# Patient Record
Sex: Male | Born: 1985 | Race: White | Hispanic: No | Marital: Single | Smoking: Never smoker
Health system: Southern US, Community
[De-identification: ages and names within clinical notes are randomized; demographics above are authoritative.]

## PROBLEM LIST (undated history)

## (undated) DIAGNOSIS — E785 Hyperlipidemia, unspecified: Secondary | ICD-10-CM

## (undated) DIAGNOSIS — I10 Essential (primary) hypertension: Secondary | ICD-10-CM

## (undated) DIAGNOSIS — F419 Anxiety disorder, unspecified: Secondary | ICD-10-CM

## (undated) HISTORY — DX: Hyperlipidemia, unspecified: E78.5

## (undated) HISTORY — DX: Essential (primary) hypertension: I10

## (undated) HISTORY — DX: Anxiety disorder, unspecified: F41.9

## (undated) HISTORY — PX: MOLE REMOVAL: SHX2046

---

## 2021-08-18 ENCOUNTER — Telehealth: Payer: Self-pay

## 2021-08-18 NOTE — Telephone Encounter (Signed)
Left vm to confirm 08/22/21 appointment-[name] °

## 2021-08-22 ENCOUNTER — Encounter: Payer: Self-pay | Admitting: Nurse Practitioner

## 2021-08-22 ENCOUNTER — Other Ambulatory Visit: Payer: Self-pay

## 2021-08-22 ENCOUNTER — Ambulatory Visit: Payer: BC Managed Care – PPO | Admitting: Nurse Practitioner

## 2021-08-22 VITALS — BP 130/80 | HR 60 | Temp 98.7°F | Resp 16 | Ht 67.5 in | Wt 194.6 lb

## 2021-08-22 DIAGNOSIS — E559 Vitamin D deficiency, unspecified: Secondary | ICD-10-CM | POA: Diagnosis not present

## 2021-08-22 DIAGNOSIS — R221 Localized swelling, mass and lump, neck: Secondary | ICD-10-CM | POA: Diagnosis not present

## 2021-08-22 DIAGNOSIS — E782 Mixed hyperlipidemia: Secondary | ICD-10-CM | POA: Diagnosis not present

## 2021-08-22 DIAGNOSIS — K219 Gastro-esophageal reflux disease without esophagitis: Secondary | ICD-10-CM | POA: Diagnosis not present

## 2021-08-22 DIAGNOSIS — Z7689 Persons encountering health services in other specified circumstances: Secondary | ICD-10-CM

## 2021-08-22 NOTE — Progress Notes (Signed)
[facility] [place], [place] [address]  Internal MEDICINE  Office Visit Note  Patient Name: Daniel Lowery.  [id number]  [id number]  Date of Service: 09/17/2021   Complaints/HPI Pt is here for establishment of PCP. Chief Complaint  Patient presents with   New Patient (Initial Visit)    Knot in throat, has been there for years, not painful but can tell its there, causes some pressure    HPI Daniel Lowery presents for a new patient visit to establish care.  He is a well-appearing 36 year old male with gastroesophageal reflux and no other significant medical conditions.  He lives at home with family and works full-time driving a delivery truck.  He is a former smoker and quit approximately 1 year ago and he vaped for a few months after quitting smoking.  He drinks approximately 6-12 beers per week and denies any use of recreational drugs.  His main concern today is that he does have a knot or lump on his neck that has been there for a few years.  He reports that the lump is not painful but he can tell that it is there and sometimes it causes some pressure on his throat.  He denies having any difficulty breathing due to it or having it swell or become red or tender like it is infected. He is due for an annual physical exam and routine labs.  He is not due for any other preventive screenings.  His vital signs are within normal limits and his BMI is elevated.     Current Medication: Outpatient Encounter Medications as of 08/22/2021  Medication Sig   famotidine (PEPCID AC) 10 MG tablet Take 10 mg by mouth 2 (two) times daily.   No facility-administered encounter medications on file as of 08/22/2021.    Surgical History: Past Surgical History:  Procedure Laterality Date   MOLE REMOVAL     2 moles on back removed    Medical History: History reviewed. No pertinent past medical history.  Family History: Family History  Problem Relation Age of Onset   Cancer  Mother    Hypertension Father    Heart disease Father    Heart disease Paternal Grandfather     Social History   Socioeconomic History   Marital status: Significant Other    Spouse name: Not on file   Number of children: Not on file   Years of education: Not on file   Highest education level: Not on file  Occupational History   Not on file  Tobacco Use   Smoking status: Never   Smokeless tobacco: Current  Substance and Sexual Activity   Alcohol use: Yes   Drug use: Not Currently   Sexual activity: Not on file  Other Topics Concern   Not on file  Social History Narrative   Not on file   Social Determinants of Health   Financial Resource Strain: Not on file  Food Insecurity: Not on file  Transportation Needs: Not on file  Physical Activity: Not on file  Stress: Not on file  Social Connections: Not on file  Intimate Partner Violence: Not on file     Review of Systems  Constitutional:  Negative for chills, fatigue and unexpected weight change.  HENT:  Negative for congestion, rhinorrhea, sneezing and sore throat.   Eyes:  Negative for redness.  Respiratory:  Negative for cough, chest tightness and shortness of breath.   Cardiovascular:  Negative for chest pain and palpitations.  Gastrointestinal:  Negative  for abdominal pain, constipation, diarrhea, nausea and vomiting.  Genitourinary:  Negative for dysuria and frequency.  Musculoskeletal:  Negative for arthralgias, back pain, joint swelling and neck pain.  Skin:  Negative for rash.  Neurological: Negative.  Negative for tremors and numbness.  Hematological:  Negative for adenopathy. Does not bruise/bleed easily.  Psychiatric/Behavioral:  Negative for behavioral problems (Depression), sleep disturbance and suicidal ideas. The patient is not nervous/anxious.    Vital Signs: BP 130/80 Comment: 154/100   Pulse 60    Temp 98.7 F (37.1 C)    Resp 16    Ht 5' 7.5" (1.715 m)    Wt 194 lb 9.6 oz (88.3 kg)    SpO2 99%     BMI 30.03 kg/m    Physical Exam Vitals reviewed.  Constitutional:      General: He is not in acute distress.    Appearance: Normal appearance. He is obese. He is not ill-appearing.  HENT:     Head: Normocephalic and atraumatic.  Eyes:     Pupils: Pupils are equal, round, and reactive to light.  Cardiovascular:     Rate and Rhythm: Normal rate and regular rhythm.  Pulmonary:     Effort: Pulmonary effort is normal. No respiratory distress.  Skin:    Comments: Lump/mass on right side of neck. Nontender, not painful, soft and compressible, moveable.   Neurological:     Mental Status: He is alert and oriented to person, place, and time.  Psychiatric:        Mood and Affect: Mood normal.        Behavior: Behavior normal.      Assessment/Plan: 1. Localized swelling, mass or lump of neck Patient has a lump/mass on the right side of his neck, ultrasound ordered for further evaluation. Additional labs ordered. Thyroid lab ordered. Lump/mass is not in the area of the thyroid.  - CBC with Differential/Platelet - CMP14+EGFR - US SOFT TISSUE HEAD & NECK (NON-THYROID); Future - TSH + free T4  2. Gastroesophageal reflux disease without esophagitis Labs ordered. Currently patient avoids triggers for acid reflux. Takes famotidine 10 mg twice daily.  - CBC with Differential/Platelet - CMP14+EGFR  3. Vitamin D deficiency Labs ordered.  - CBC with Differential/Platelet - CMP14+EGFR - Vitamin D (25 hydroxy)  4. Mixed hyperlipidemia Routine labs ordered.  - CBC with Differential/Platelet - CMP14+EGFR - Lipid Profile  5. Encounter to establish care with new doctor Routine labs ordered. Patient has never had a PCP, feels like he needs one now and wants to keep track of his health and preventive screenings as he gets older.  - CBC with Differential/Platelet - CMP14+EGFR    General Counseling: diago haik understanding of the findings of todays visit and agrees with plan of  treatment. I have discussed any further diagnostic evaluation that may be needed or ordered today. We also reviewed his medications today. he has been encouraged to call the office with any questions or concerns that should arise related to todays visit.    Counseling:  [place] Controlled Substance Database was reviewed by me.  Orders Placed This Encounter  Procedures   US SOFT TISSUE HEAD & NECK (NON-THYROID)   CBC with Differential/Platelet   CMP14+EGFR   Vitamin D (25 hydroxy)   Lipid Profile   TSH + free T4    No orders of the defined types were placed in this encounter.   Return for CPE at earliest available opening. also need F/U for neck ultrasound.  Time spent:30  Minutes Time spent with patient included reviewing progress notes, labs, imaging studies, and discussing plan for follow up.    This patient was seen by Jonetta Osgood, FNP-C in collaboration with Dr. Clayborn Bigness as a part of collaborative care agreement.    Kyro Joswick R. Valetta Fuller, MSN, FNP-C Internal Medicine

## 2021-08-29 DIAGNOSIS — Z7689 Persons encountering health services in other specified circumstances: Secondary | ICD-10-CM | POA: Diagnosis not present

## 2021-08-29 DIAGNOSIS — K219 Gastro-esophageal reflux disease without esophagitis: Secondary | ICD-10-CM | POA: Diagnosis not present

## 2021-08-29 DIAGNOSIS — E559 Vitamin D deficiency, unspecified: Secondary | ICD-10-CM | POA: Diagnosis not present

## 2021-08-29 DIAGNOSIS — E782 Mixed hyperlipidemia: Secondary | ICD-10-CM | POA: Diagnosis not present

## 2021-08-29 DIAGNOSIS — R221 Localized swelling, mass and lump, neck: Secondary | ICD-10-CM | POA: Diagnosis not present

## 2021-08-29 LAB — LIPID PANEL

## 2021-08-30 LAB — CBC WITH DIFFERENTIAL/PLATELET
Basophils Absolute: 0.1 10*3/uL (ref 0.0–0.2)
Basos: 1 %
EOS (ABSOLUTE): 0.1 10*3/uL (ref 0.0–0.4)
Eos: 1 %
Hematocrit: 49.6 % (ref 37.5–51.0)
Hemoglobin: 16.8 g/dL (ref 13.0–17.7)
Immature Grans (Abs): 0 10*3/uL (ref 0.0–0.1)
Immature Granulocytes: 0 %
Lymphocytes Absolute: 2.1 10*3/uL (ref 0.7–3.1)
Lymphs: 30 %
MCH: 30.4 pg (ref 26.6–33.0)
MCHC: 33.9 g/dL (ref 31.5–35.7)
MCV: 90 fL (ref 79–97)
Monocytes Absolute: 0.6 10*3/uL (ref 0.1–0.9)
Monocytes: 9 %
Neutrophils Absolute: 4.2 10*3/uL (ref 1.4–7.0)
Neutrophils: 59 %
Platelets: 291 10*3/uL (ref 150–450)
RBC: 5.53 x10E6/uL (ref 4.14–5.80)
RDW: 12.6 % (ref 11.6–15.4)
WBC: 7.1 10*3/uL (ref 3.4–10.8)

## 2021-08-30 LAB — CMP14+EGFR
ALT: 33 IU/L (ref 0–44)
AST: 50 IU/L — ABNORMAL HIGH (ref 0–40)
Albumin/Globulin Ratio: 1.9 (ref 1.2–2.2)
Albumin: 5.1 g/dL — ABNORMAL HIGH (ref 4.0–5.0)
Alkaline Phosphatase: 78 IU/L (ref 44–121)
BUN/Creatinine Ratio: 10 (ref 9–20)
BUN: 10 mg/dL (ref 6–20)
Bilirubin Total: 0.7 mg/dL (ref 0.0–1.2)
CO2: 24 mmol/L (ref 20–29)
Calcium: 9.8 mg/dL (ref 8.7–10.2)
Chloride: 99 mmol/L (ref 96–106)
Creatinine, Ser: 0.99 mg/dL (ref 0.76–1.27)
Globulin, Total: 2.7 g/dL (ref 1.5–4.5)
Glucose: 101 mg/dL — ABNORMAL HIGH (ref 70–99)
Potassium: 4.3 mmol/L (ref 3.5–5.2)
Sodium: 141 mmol/L (ref 134–144)
Total Protein: 7.8 g/dL (ref 6.0–8.5)
eGFR: 103 mL/min/{1.73_m2} (ref >59)

## 2021-08-30 LAB — LIPID PANEL
Chol/HDL Ratio: 6 ratio — ABNORMAL HIGH (ref 0.0–5.0)
Cholesterol, Total: 234 mg/dL — ABNORMAL HIGH (ref 100–199)
HDL: 39 mg/dL — ABNORMAL LOW (ref >39)
LDL Chol Calc (NIH): 115 mg/dL — ABNORMAL HIGH (ref 0–99)
Triglycerides: 456 mg/dL — ABNORMAL HIGH (ref 0–149)
VLDL Cholesterol Cal: 80 mg/dL — ABNORMAL HIGH (ref 5–40)

## 2021-08-30 LAB — TSH+FREE T4
Free T4: 1.33 ng/dL (ref 0.82–1.77)
TSH: 1.76 u[IU]/mL (ref 0.450–4.500)

## 2021-08-30 LAB — VITAMIN D 25 HYDROXY (VIT D DEFICIENCY, FRACTURES): Vit D, 25-Hydroxy: 17.6 ng/mL — ABNORMAL LOW (ref 30.0–100.0)

## 2021-09-06 ENCOUNTER — Other Ambulatory Visit: Payer: Self-pay

## 2021-09-06 ENCOUNTER — Ambulatory Visit (INDEPENDENT_AMBULATORY_CARE_PROVIDER_SITE_OTHER): Payer: BC Managed Care – PPO

## 2021-09-06 DIAGNOSIS — R221 Localized swelling, mass and lump, neck: Secondary | ICD-10-CM | POA: Diagnosis not present

## 2021-09-08 NOTE — Progress Notes (Signed)
I have reviewed the lab results. There are no critically abnormal values requiring immediate intervention but there are some abnormals that will be discussed at the next office visit.  

## 2021-09-17 ENCOUNTER — Encounter: Payer: Self-pay | Admitting: Nurse Practitioner

## 2021-09-18 ENCOUNTER — Telehealth: Payer: Self-pay

## 2021-09-18 NOTE — Telephone Encounter (Signed)
Left vm and sent mychart message to confirm 09/20/21 appointment-[name] ?

## 2021-09-20 ENCOUNTER — Other Ambulatory Visit: Payer: Self-pay

## 2021-09-20 ENCOUNTER — Encounter: Payer: Self-pay | Admitting: Nurse Practitioner

## 2021-09-20 ENCOUNTER — Ambulatory Visit (INDEPENDENT_AMBULATORY_CARE_PROVIDER_SITE_OTHER): Payer: BC Managed Care – PPO | Admitting: Nurse Practitioner

## 2021-09-20 VITALS — BP 134/88 | HR 97 | Temp 98.5°F | Resp 16 | Ht 67.5 in | Wt 191.4 lb

## 2021-09-20 DIAGNOSIS — Z0001 Encounter for general adult medical examination with abnormal findings: Secondary | ICD-10-CM

## 2021-09-20 DIAGNOSIS — R221 Localized swelling, mass and lump, neck: Secondary | ICD-10-CM

## 2021-09-20 DIAGNOSIS — E559 Vitamin D deficiency, unspecified: Secondary | ICD-10-CM | POA: Diagnosis not present

## 2021-09-20 DIAGNOSIS — R300 Dysuria: Secondary | ICD-10-CM | POA: Diagnosis not present

## 2021-09-20 DIAGNOSIS — K219 Gastro-esophageal reflux disease without esophagitis: Secondary | ICD-10-CM | POA: Diagnosis not present

## 2021-09-20 DIAGNOSIS — E782 Mixed hyperlipidemia: Secondary | ICD-10-CM | POA: Diagnosis not present

## 2021-09-20 MED ORDER — ICOSAPENT ETHYL 1 G PO CAPS: 2.0000 g | ORAL_CAPSULE | Freq: Two times a day (BID) | ORAL | 3 refills | Status: DC

## 2021-09-20 MED ORDER — VITAMIN D (ERGOCALCIFEROL) 1.25 MG (50000 UNIT) PO CAPS: 50000.0000 [IU] | ORAL_CAPSULE | ORAL | 4 refills | Status: DC

## 2021-09-20 NOTE — Progress Notes (Unsigned)
[facility] [place], [place] [address]  Internal MEDICINE  Office Visit Note  Patient Name: Daniel Lowery  [id number]  [id number]  Date of Service: 09/20/2021  Chief Complaint  Patient presents with   Annual Exam   Results    [place]    HPI [place] presents for an annual well visit and physical exam.  He is a well-appearing 36 year old male with hyperlipidemia and elevated blood pressure without a diagnosis of hypertension.  He also takes famotidine for symptoms of gastroesophageal reflux.  At his previous office visit, the patient was establishing care with a new PCP and voiced a concern of a lump on the right side of his neck is sometimes sore.  In ultrasound of the soft tissue of the neck was ordered but an ultrasound of the thyroid is what was performed.  The result of the thyroid ultrasound was normal but the area on the right side of the neck of concern was not examined via ultrasound. Reviewed labs with the patient today.  His thyroid levels and CBC were normal.  His vitamin D was significantly low at 17.6.  His metabolic panel is normal except for a slightly elevated albumin level of 5.1 and a slightly elevated AST of 50.  All cholesterol levels were abnormal on the lipid panel.  His triglyceride level is significantly elevated at 456, LDL is 115, VLDL is 80 and total cholesterol is 234.  His HDL is just below normal at 39.  His cholesterol/HDL ratio is slightly elevated at 6.0 which is slightly greater than average risk for cardiovascular disease.  He is not within age range to estimate ASCVD risk. His blood pressure is significantly elevated but improved when rechecked manually, see vitals. --Patient reports that he quit smoking cigarettes a couple months ago.  After seeing the results of his cholesterol levels prior to his office visit today, patient reports that he started cutting back on his alcohol consumption and started decreasing the amount of red meat  that he eats and eating more lean proteins including chicken, [place], and fish.    Current Medication: Outpatient Encounter Medications as of 09/20/2021  Medication Sig   famotidine (PEPCID) 10 MG tablet Take 10 mg by mouth 2 (two) times daily.   icosapent Ethyl (VASCEPA) 1 g capsule Take 2 capsules (2 g total) by mouth 2 (two) times daily.   Vitamin D, Ergocalciferol, (DRISDOL) 1.25 MG (50000 UNIT) CAPS capsule Take 1 capsule (50,000 Units total) by mouth every 7 (seven) days.   No facility-administered encounter medications on file as of 09/20/2021.    Surgical History: Past Surgical History:  Procedure Laterality Date   MOLE REMOVAL     2 moles on back removed    Medical History: History reviewed. No pertinent past medical history.  Family History: Family History  Problem Relation Age of Onset   Cancer Mother    Hypertension Father    Heart disease Father    Heart disease Paternal Grandfather     Social History   Socioeconomic History   Marital status: Significant Other    Spouse name: Not on file   Number of children: Not on file   Years of education: Not on file   Highest education level: Not on file  Occupational History   Not on file  Tobacco Use   Smoking status: Never   Smokeless tobacco: Current    Types: Chew  Substance and Sexual Activity   Alcohol use: Yes   Drug  use: Not Currently   Sexual activity: Not on file  Other Topics Concern   Not on file  Social History Narrative   Not on file   Social Determinants of Health   Financial Resource Strain: Not on file  Food Insecurity: Not on file  Transportation Needs: Not on file  Physical Activity: Not on file  Stress: Not on file  Social Connections: Not on file  Intimate Partner Violence: Not on file      Review of Systems  Constitutional:  Negative for activity change, appetite change, chills, fatigue, fever and unexpected weight change.  HENT: Negative.  Negative for congestion, ear pain,  rhinorrhea, sore throat and trouble swallowing.   Eyes: Negative.   Respiratory: Negative.  Negative for cough, chest tightness, shortness of breath and wheezing.   Cardiovascular: Negative.  Negative for chest pain.  Gastrointestinal: Negative.  Negative for abdominal pain, blood in stool, constipation, diarrhea, nausea and vomiting.  Endocrine: Negative.   Genitourinary: Negative.  Negative for difficulty urinating, dysuria, frequency, hematuria and urgency.  Musculoskeletal: Negative.  Negative for arthralgias, back pain, joint swelling, myalgias and neck pain.  Skin: Negative.  Negative for rash and wound.  Allergic/Immunologic: Negative.  Negative for immunocompromised state.  Neurological: Negative.  Negative for dizziness, seizures, numbness and headaches.  Hematological: Negative.   Psychiatric/Behavioral: Negative.  Negative for behavioral problems, self-injury and suicidal ideas. The patient is not nervous/anxious.    Vital Signs: BP (!) 150/102    Pulse 97    Temp 98.5 F (36.9 C)    Resp 16    Ht 5' 7.5" (1.715 m)    Wt 191 lb 6.4 oz (86.8 kg)    SpO2 99%    BMI 29.54 kg/m    Physical Exam Vitals reviewed.  Constitutional:      General: He is not in acute distress.    Appearance: He is well-developed. He is not diaphoretic.  HENT:     Head: Normocephalic and atraumatic.     Right Ear: External ear normal.     Left Ear: External ear normal.     Nose: Nose normal.     Mouth/Throat:     Pharynx: No oropharyngeal exudate.  Eyes:     General: No scleral icterus.       Right eye: No discharge.        Left eye: No discharge.     Conjunctiva/sclera: Conjunctivae normal.     Pupils: Pupils are equal, round, and reactive to light.  Neck:     Thyroid: No thyromegaly.     Vascular: No JVD.     Trachea: No tracheal deviation.  Cardiovascular:     Rate and Rhythm: Normal rate and regular rhythm.     Heart sounds: Normal heart sounds. No murmur heard.   No friction rub. No  gallop.  Pulmonary:     Effort: Pulmonary effort is normal. No respiratory distress.     Breath sounds: Normal breath sounds. No stridor. No wheezing or rales.  Chest:     Chest wall: No tenderness.  Abdominal:     General: Bowel sounds are normal. There is no distension.     Palpations: Abdomen is soft. There is no mass.     Tenderness: There is no abdominal tenderness. There is no guarding or rebound.  Musculoskeletal:        General: No tenderness or deformity. Normal range of motion.     Cervical back: Normal range of motion and neck supple.  Lymphadenopathy:     Cervical: No cervical adenopathy.  Skin:    General: Skin is warm and dry.     Coloration: Skin is not pale.     Findings: No erythema or rash.  Neurological:     Mental Status: He is alert.     Cranial Nerves: No cranial nerve deficit.     Motor: No abnormal muscle tone.     Coordination: Coordination normal.     Deep Tendon Reflexes: Reflexes are normal and symmetric.  Psychiatric:        Behavior: Behavior normal.        Thought Content: Thought content normal.        Judgment: Judgment normal.       Assessment/Plan:      General Counseling: Lisbeth Ply understanding of the findings of todays visit and agrees with plan of treatment. I have discussed any further diagnostic evaluation that may be needed or ordered today. We also reviewed his medications today. he has been encouraged to call the office with any questions or concerns that should arise related to todays visit.    Orders Placed This Encounter  Procedures   UA/M w/rflx Culture, Routine    Meds ordered this encounter  Medications   icosapent Ethyl (VASCEPA) 1 g capsule    Sig: Take 2 capsules (2 g total) by mouth 2 (two) times daily.    Dispense:  120 capsule    Refill:  3   Vitamin D, Ergocalciferol, (DRISDOL) 1.25 MG (50000 UNIT) CAPS capsule    Sig: Take 1 capsule (50,000 Units total) by mouth every 7 (seven) days.     Dispense:  5 capsule    Refill:  4    Return for U/S @ [place] had the incorrect US done, need follow also in 3 months for cholesterol and BP.   Total time spent:*** Minutes Time spent includes review of chart, medications, test results, and follow up plan with the patient.   [place] Controlled Substance Database was reviewed by me.  This patient was seen by Jonetta Osgood, FNP-C in collaboration with Dr. Clayborn Bigness as a part of collaborative care agreement.  Tedi Hughson R. Valetta Fuller, MSN, FNP-C Internal medicine

## 2021-09-21 LAB — MICROSCOPIC EXAMINATION
Bacteria, UA: NONE SEEN
Casts: NONE SEEN /lpf
Epithelial Cells (non renal): NONE SEEN /hpf (ref 0–10)
RBC, Urine: NONE SEEN /hpf (ref 0–2)
WBC, UA: NONE SEEN /hpf (ref 0–5)

## 2021-09-21 LAB — UA/M W/RFLX CULTURE, ROUTINE
Bilirubin, UA: NEGATIVE
Glucose, UA: NEGATIVE
Ketones, UA: NEGATIVE
Leukocytes,UA: NEGATIVE
Nitrite, UA: NEGATIVE
Protein,UA: NEGATIVE
RBC, UA: NEGATIVE
Specific Gravity, UA: <=1.005 — AB (ref 1.005–1.030)
Urobilinogen, Ur: 0.2 mg/dL (ref 0.2–1.0)
pH, UA: 7 (ref 5.0–7.5)

## 2021-10-09 ENCOUNTER — Emergency Department (HOSPITAL_COMMUNITY)
Admission: EM | Admit: 2021-10-09 | Discharge: 2021-10-10 | Disposition: A | Discharge status: Home / Self Care | Payer: BC Managed Care – PPO | Attending: Emergency Medicine | Admitting: Emergency Medicine

## 2021-10-09 ENCOUNTER — Other Ambulatory Visit: Payer: Self-pay

## 2021-10-09 ENCOUNTER — Emergency Department (HOSPITAL_COMMUNITY): Payer: BC Managed Care – PPO

## 2021-10-09 DIAGNOSIS — R079 Chest pain, unspecified: Secondary | ICD-10-CM

## 2021-10-09 DIAGNOSIS — R0789 Other chest pain: Secondary | ICD-10-CM | POA: Diagnosis not present

## 2021-10-09 LAB — BASIC METABOLIC PANEL
Anion gap: 9 (ref 5–15)
BUN: 6 mg/dL (ref 6–20)
CO2: 24 mmol/L (ref 22–32)
Calcium: 9.4 mg/dL (ref 8.9–10.3)
Chloride: 105 mmol/L (ref 98–111)
Creatinine, Ser: 0.98 mg/dL (ref 0.61–1.24)
GFR, Estimated: >60 mL/min (ref >60)
Glucose, Bld: 106 mg/dL — ABNORMAL HIGH (ref 70–99)
Potassium: 3.7 mmol/L (ref 3.5–5.1)
Sodium: 138 mmol/L (ref 135–145)

## 2021-10-09 LAB — CBC WITH DIFFERENTIAL/PLATELET
Abs Immature Granulocytes: 0.02 10*3/uL (ref 0.00–0.07)
Basophils Absolute: 0 10*3/uL (ref 0.0–0.1)
Basophils Relative: 1 %
Eosinophils Absolute: 0.1 10*3/uL (ref 0.0–0.5)
Eosinophils Relative: 1 %
HCT: 47.5 % (ref 39.0–52.0)
Hemoglobin: 16.1 g/dL (ref 13.0–17.0)
Immature Granulocytes: 0 %
Lymphocytes Relative: 27 %
Lymphs Abs: 2 10*3/uL (ref 0.7–4.0)
MCH: 30.7 pg (ref 26.0–34.0)
MCHC: 33.9 g/dL (ref 30.0–36.0)
MCV: 90.5 fL (ref 80.0–100.0)
Monocytes Absolute: 0.7 10*3/uL (ref 0.1–1.0)
Monocytes Relative: 10 %
Neutro Abs: 4.7 10*3/uL (ref 1.7–7.7)
Neutrophils Relative %: 61 %
Platelets: 259 10*3/uL (ref 150–400)
RBC: 5.25 MIL/uL (ref 4.22–5.81)
RDW: 12 % (ref 11.5–15.5)
WBC: 7.5 10*3/uL (ref 4.0–10.5)
nRBC: 0 % (ref 0.0–0.2)

## 2021-10-09 LAB — TROPONIN I (HIGH SENSITIVITY): Troponin I (High Sensitivity): 3 ng/L (ref <18)

## 2021-10-09 NOTE — ED Triage Notes (Signed)
Patient coming from home, complaint of chest pain that started yesterday, worse today. Pt endorses family history of MI at a young age. VSS. NAD. ?

## 2021-10-09 NOTE — ED Provider Triage Note (Signed)
Emergency Medicine Provider Triage Evaluation Note ? ?Daniel Lowery. , a 36 y.o. male  was evaluated in triage.  Pt complains of chest pain.  Chest pain started yesterday at noon.  Pain is midsternal and does not radiate.  Patient describes pain as a pressure.  Patient reports the pain is intermittent however states that pain is been present more than it has been absent. ? ?Denies any associated nausea, vomiting, diaphoresis, lightheadedness, syncope, palpitations, leg swelling or tenderness ? ?Review of Systems  ?Positive: Chest pain ?Negative: See above ? ?Physical Exam  ?BP (!) 142/100   Pulse 79   Temp 98.1 ?F (36.7 ?C) (Oral)   Resp 16   SpO2 100%  ?Gen:   Awake, no distress   ?Resp:  Normal effort, clear to auscultation bilaterally ?MSK:   Moves extremities without difficulty; no swelling or tenderness of bilateral lower extremities ?Other:  +2 radial pulse bilaterally.  S1, S2 present with no murmurs rubs or gallops. ? ?Medical Decision Making  ?Medically screening exam initiated at 7:02 PM.  Appropriate orders placed.  Casimiro [employer]. was informed that the remainder of the evaluation will be completed by another provider, this initial triage assessment does not replace that evaluation, and the importance of remaining in the ED until their evaluation is complete. ? ? ?  ?Haskel Schroeder, PA-C ?10/09/21 1903 ? ?

## 2021-10-10 LAB — TROPONIN I (HIGH SENSITIVITY): Troponin I (High Sensitivity): 3 ng/L (ref <18)

## 2021-10-10 NOTE — ED Provider Notes (Signed)
? ?[facility] EMERGENCY DEPARTMENT  ?Provider Note ? ?CSN: [id number] ?Arrival date & time: 10/09/21 1731 ? ?History ?Chief Complaint  ?Patient presents with  ? Chest Pain  ? ? ?Odel Schmid. is a 36 y.o. male with no known CAD but has a strong family history of early MI reports an aching pressure pain in mid chest for the last 2 days, comes and goes, no particular provoking factors. Not associated with exertion. Does not radiate, no SOB, nausea or diaphoresis. He was recently diagnosed with high triglycerides, Has stopped smoking and is now eating healthier as a result. Takes Pepcid for GERD but those symptoms are different from these.  ? ? ?Home Medications ?Prior to Admission medications   ?Medication Sig Start Date End Date Taking? Authorizing Provider  ?famotidine (PEPCID) 10 MG tablet Take 10 mg by mouth 2 (two) times daily.    [provider]  ?icosapent Ethyl (VASCEPA) 1 g capsule Take 2 capsules (2 g total) by mouth 2 (two) times daily. 09/20/21   Sallyanne Kuster, NP  ?Vitamin D, Ergocalciferol, (DRISDOL) 1.25 MG (50000 UNIT) CAPS capsule Take 1 capsule (50,000 Units total) by mouth every 7 (seven) days. 09/20/21   Sallyanne Kuster, NP  ? ? ? ?Allergies    ?Patient has no known allergies. ? ? ?Review of Systems   ?Review of Systems ?Please see HPI for pertinent positives and negatives ? ?Physical Exam ?BP 116/89 (BP Location: Right Arm)   Pulse (!) 50   Temp 98.1 ?F (36.7 ?C) (Oral)   Resp 18   SpO2 99%  ? ?Physical Exam ?Vitals and nursing note reviewed.  ?Constitutional:   ?   Appearance: Normal appearance.  ?HENT:  ?   Head: Normocephalic and atraumatic.  ?   Nose: Nose normal.  ?   Mouth/Throat:  ?   Mouth: Mucous membranes are moist.  ?Eyes:  ?   Extraocular Movements: Extraocular movements intact.  ?   Conjunctiva/sclera: Conjunctivae normal.  ?Cardiovascular:  ?   Rate and Rhythm: Normal rate.  ?Pulmonary:  ?   Effort: Pulmonary effort is normal.  ?   Breath  sounds: Normal breath sounds.  ?Abdominal:  ?   General: Abdomen is flat.  ?   Palpations: Abdomen is soft.  ?   Tenderness: There is no abdominal tenderness.  ?Musculoskeletal:     ?   General: No swelling. Normal range of motion.  ?   Cervical back: Neck supple.  ?Skin: ?   General: Skin is warm and dry.  ?Neurological:  ?   General: No focal deficit present.  ?   Mental Status: He is alert.  ?Psychiatric:     ?   Mood and Affect: Mood normal.  ? ? ?ED Results / Procedures / Treatments   ?EKG ?EKG Interpretation ? ?Date/Time:  Monday October 09 2021 18:41:54 EDT ?Ventricular Rate:  62 ?PR Interval:  140 ?QRS Duration: 106 ?QT Interval:  420 ?QTC Calculation: 426 ?R Axis:   88 ?Text Interpretation: Sinus rhythm with marked sinus arrhythmia Otherwise normal ECG No previous ECGs available Confirmed by Susy Frizzle [phone]) on 10/10/2021 3:17:21 AM ? ?Procedures ?Procedures ? ?Medications Ordered in the ED ?Medications - No data to display ? ?Initial Impression and Plan ? Patient with atypical chest pains but strong family history. Had labs and imaging done in triage. CBC, BMP and Trop x 2 all normal. I personally viewed the images from radiology studies and agree with radiologist interpretation: CXR is clear.  His HEART Pathway score is 3, low risk. Recommend outpatient Cards follow up for further evaluation. Patient reassured no signs of MI at this ED visit. He lives closer to [place] and would prefer to see Cards there.  ? ? ?ED Course  ? ?  ? ? ?MDM Rules/Calculators/A&P ?Medical Decision Making ?Given presenting complaint, I considered that admission might be necessary. After review of results from ED lab and/or imaging studies, admission to the hospital is not indicated at this time.  ? ? ?Amount and/or Complexity of Data Reviewed ?Labs: ordered. Decision-making details documented in ED Course. ?Radiology: ordered and independent interpretation performed. Decision-making details documented in ED  Course. ?ECG/medicine tests: ordered and independent interpretation performed. Decision-making details documented in ED Course. ? ?Risk ?Decision regarding hospitalization. ? ? ? ?Final Clinical Impression(s) / ED Diagnoses ?Final diagnoses:  ?Nonspecific chest pain  ? ? ?Rx / DC Orders ?ED Discharge Orders   ? ?      Ordered  ?  Ambulatory referral to Cardiology       ? 10/10/21 0331  ? ?  ?  ? ?  ? ?  ?Pollyann Savoy, MD ?10/10/21 [phone] ? ?

## 2021-10-11 ENCOUNTER — Encounter: Payer: Self-pay | Admitting: Nurse Practitioner

## 2021-10-11 DIAGNOSIS — E781 Pure hyperglyceridemia: Secondary | ICD-10-CM | POA: Insufficient documentation

## 2021-10-12 ENCOUNTER — Telehealth: Payer: Self-pay

## 2021-10-12 NOTE — Telephone Encounter (Signed)
-----   Message from Sallyanne Kuster, NP sent at 10/11/2021  5:27 PM EDT ----- ?Regarding: new PCP? ?Hi Alex,  ?Please call patient and find out if he is still planning to keep me/us as his PCP. He had a Mordecai Maes listed as his PCP which was changed 2 days ago and when I looked he had an appointment scheduled in April that was made on 09/21/2021 the day after I saw him for his annual physical exam. If he is not planning on staying with [place] please cancel his other appointments with me and let [name] Amend know so she can discharge him from the practice.  ?If he is keeping [place] as his PCP, please find out what this appt with Mordecai Maes is for?  ? ?Thanks, Alyssa ? ?

## 2021-10-12 NOTE — Telephone Encounter (Signed)
LMOM for pt to confirm if we are still his PCP and if not, to cancel any future appts.  ?

## 2021-10-16 ENCOUNTER — Telehealth: Payer: Self-pay

## 2021-10-16 NOTE — Telephone Encounter (Signed)
Lvm and sent mychart message to patient to verify if we are still his primary provider-[name] ?

## 2021-10-17 ENCOUNTER — Telehealth: Payer: Self-pay

## 2021-10-17 NOTE — Telephone Encounter (Signed)
I contacted patient this morning in regards to ultrasound that was cancelled on 10/25/21. Advised patient we were putting him back on the schedule with no charge to the patient per DFK. ?

## 2021-10-23 ENCOUNTER — Telehealth: Payer: Self-pay

## 2021-10-23 NOTE — Telephone Encounter (Signed)
Left vm to confirm 10/25/21 appointment-[name] ?

## 2021-10-25 ENCOUNTER — Ambulatory Visit (INDEPENDENT_AMBULATORY_CARE_PROVIDER_SITE_OTHER): Payer: BC Managed Care – PPO

## 2021-10-25 ENCOUNTER — Other Ambulatory Visit: Payer: BC Managed Care – PPO

## 2021-10-25 DIAGNOSIS — R221 Localized swelling, mass and lump, neck: Secondary | ICD-10-CM | POA: Diagnosis not present

## 2021-11-03 ENCOUNTER — Encounter: Payer: Self-pay | Admitting: Nurse Practitioner

## 2021-11-03 ENCOUNTER — Ambulatory Visit: Payer: BC Managed Care – PPO | Admitting: Nurse Practitioner

## 2021-11-03 VITALS — BP 134/86 | HR 97 | Temp 98.5°F | Resp 14 | Ht 67.5 in | Wt 180.0 lb

## 2021-11-03 DIAGNOSIS — R590 Localized enlarged lymph nodes: Secondary | ICD-10-CM

## 2021-11-03 DIAGNOSIS — Z0000 Encounter for general adult medical examination without abnormal findings: Secondary | ICD-10-CM

## 2021-11-03 DIAGNOSIS — E7800 Pure hypercholesterolemia, unspecified: Secondary | ICD-10-CM

## 2021-11-03 NOTE — Patient Instructions (Signed)
Nice to see you today ?I referred you to Ear, Nose, Throat they should contact you to schedule an appointment in 2 weeks.  ?We will get you set up for a lab visit in 3 months. I want you fasting so that means on black coffee (no sugar or cream) or water before the lab draw ? ?Follow up with me around 09/28/2022 for your next physical, sooner if you need me ?

## 2021-11-03 NOTE — Assessment & Plan Note (Signed)
Has had an ultrasound of neck soft tissue.  Did demonstrate a borderline lymph node to the right neck.  Patient states he had it for an extended period of time but it is concerning to him as it is uncomfortable.  After discussion and joint decision making patient would like to see an ENT for further evaluation peace of mind.  Ambulatory referral placed for ear nose throat. ?

## 2021-11-03 NOTE — Assessment & Plan Note (Signed)
Patient working on diet and exercise.  We will set up lab appointment fasting for 3 months.  Patient states he has a appoint with cardiology given his strong cardiovascular history. ?

## 2021-11-03 NOTE — Progress Notes (Signed)
? ?New Patient Office Visit ? ?Subjective   ? ?Patient ID: Daniel Lowery., male    DOB: [birth year]  Age: 36 y.o. MRN: [id number] ? ?CC:  ?Chief Complaint  ?Patient presents with  ? Establish Care  ?  Previous PCP Alyssa Abernathy  ? Results  ?  To go over [place] results of the neck  ? ? ?HPI ?Daniel Lowery. presents to establish care ? ? ?Vitamin D def: currenlty maintained on vitamin D replacement ? ?GERD: pepcid as needed. Not as much since he stopped drinking. Currenlty just using it PRN ? ?Diet:Will have banana apple and nuts for breakfast. Will do girll chicken salads for lunch and lean proteins for dinner. Coffee and water throught the day ? ?Exercise:Does hunt and will walk a lot. Recommended 30 mins a day 5 times a week ? ?Will check BP at home randomly, just for spot checking ? ? ?Outpatient Encounter Medications as of 11/03/2021  ?Medication Sig  ? famotidine (PEPCID) 10 MG tablet Take 10 mg by mouth 2 (two) times daily.  ? Omega-3 Fatty Acids (OMEGA-3 CF PO) Take by mouth. Not sure of the dosage  ? Vitamin D, Ergocalciferol, (DRISDOL) 1.25 MG (50000 UNIT) CAPS capsule Take 1 capsule (50,000 Units total) by mouth every 7 (seven) days.  ? [DISCONTINUED] icosapent Ethyl (VASCEPA) 1 g capsule Take 2 capsules (2 g total) by mouth 2 (two) times daily.  ? ?No facility-administered encounter medications on file as of 11/03/2021.  ? ? ?No past medical history on file. ? ?Past Surgical History:  ?Procedure Laterality Date  ? MOLE REMOVAL    ? 2 moles on back removed  ? ? ?Family History  ?Problem Relation Age of Onset  ? Cancer Mother 105  ?     some type of intestinal, not sure  ? Hypertension Father   ? Heart disease Father   ? Heart attack Father 47  ? Other Father   ?     CABG  ? Diabetes Maternal Grandmother   ? Hypertension Paternal Grandmother   ? Heart disease Paternal Grandfather   ? ? ?Social History  ? ?Socioeconomic History  ? Marital status: Single  ?  Spouse name: Not on file  ? Number of  children: 1  ? Years of education: Not on file  ? Highest education level: High school graduate  ?Occupational History  ? Not on file  ?Tobacco Use  ? Smoking status: Never  ? Smokeless tobacco: Current  ?  Types: Chew, Snuff  ?Substance and Sexual Activity  ? Alcohol use: Not Currently  ?  Comment: 24 to 36 cans of beer a week about 2 months ago quit drinking  ? Drug use: Never  ? Sexual activity: Not on file  ?Other Topics Concern  ? Not on file  ?Social History Narrative  ? Fulltime: Delivery driver  ? ?Social Determinants of Health  ? ?Financial Resource Strain: Not on file  ?Food Insecurity: Not on file  ?Transportation Needs: Not on file  ?Physical Activity: Not on file  ?Stress: Not on file  ?Social Connections: Not on file  ?Intimate Partner Violence: Not on file  ? ? ?Review of Systems  ?Constitutional:  Negative for chills, fever and malaise/fatigue.  ?HENT:  Negative for ear discharge, ear pain, sinus pain and sore throat.   ?Respiratory:  Negative for cough and shortness of breath.   ?Cardiovascular:  Negative for chest pain and leg swelling.  ?Gastrointestinal:  Negative for diarrhea, nausea and  vomiting.  ?     BM daily ?  ?Genitourinary:  Negative for dysuria and hematuria.  ?     Nocturia intermittent ?  ?Neurological:  Negative for dizziness, tingling, weakness and headaches.  ?Psychiatric/Behavioral:  Negative for hallucinations and suicidal ideas.   ? ?  ? ? ?Objective   ? ?BP 134/86   Pulse 97   Temp 98.5 ?F (36.9 ?C)   Resp 14   Ht 5' 7.5" (1.715 m)   Wt 180 lb (81.6 kg)   SpO2 97%   BMI 27.78 kg/m?  ? ?Physical Exam ?Vitals and nursing note reviewed.  ?Constitutional:   ?   Appearance: Normal appearance.  ?HENT:  ?   Right Ear: Tympanic membrane, ear canal and external ear normal.  ?   Left Ear: Tympanic membrane, ear canal and external ear normal.  ?   Mouth/Throat:  ?   Mouth: Mucous membranes are moist.  ?   Pharynx: Oropharynx is clear.  ?Eyes:  ?   Extraocular Movements: Extraocular  movements intact.  ?   Pupils: Pupils are equal, round, and reactive to light.  ?Neck:  ?   Thyroid: No thyroid mass, thyromegaly or thyroid tenderness.  ? ?Cardiovascular:  ?   Rate and Rhythm: Normal rate and regular rhythm.  ?   Pulses: Normal pulses.  ?   Heart sounds: Normal heart sounds.  ?Pulmonary:  ?   Effort: Pulmonary effort is normal.  ?   Breath sounds: Normal breath sounds.  ?Abdominal:  ?   General: Bowel sounds are normal. There is no distension.  ?   Palpations: There is no mass.  ?   Tenderness: There is no abdominal tenderness.  ?   Hernia: No hernia is present.  ?Musculoskeletal:  ?   Right lower leg: No edema.  ?   Left lower leg: No edema.  ?Lymphadenopathy:  ?   Cervical: Cervical adenopathy present.  ?Skin: ?   General: Skin is warm.  ?Neurological:  ?   General: No focal deficit present.  ?   Mental Status: He is alert.  ?   Deep Tendon Reflexes:  ?   Reflex Scores: ?     Bicep reflexes are 2+ on the right side and 2+ on the left side. ?     Patellar reflexes are 2+ on the right side and 2+ on the left side. ?   Comments: Bilateral upper and lower extremity strength 5/5  ?Psychiatric:     ?   Mood and Affect: Mood normal.     ?   Behavior: Behavior normal.     ?   Thought Content: Thought content normal.     ?   Judgment: Judgment normal.  ? ? ? ?  ? ?Assessment & Plan:  ? ?Problem List Items Addressed This Visit   ? ?  ? Immune and Lymphatic  ? Enlarged lymph node in neck - Primary  ?  Has had an ultrasound of neck soft tissue.  Did demonstrate a borderline lymph node to the right neck.  Patient states he had it for an extended period of time but it is concerning to him as it is uncomfortable.  After discussion and joint decision making patient would like to see an ENT for further evaluation peace of mind.  Ambulatory referral placed for ear nose throat. ? ?  ?  ? Relevant Orders  ? Ambulatory referral to ENT  ?  ? Other  ? Encounter for medical  examination to establish care  ?  Did review  last office note  from previous provider Alyssa Abernathy ? ?  ?  ? Hypercholesteremia  ?  Patient working on diet and exercise.  We will set up lab appointment fasting for 3 months.  Patient states he has a appoint with cardiology given his strong cardiovascular history. ? ?  ?  ? Relevant Orders  ? Lipid panel  ? ? ?Return in about 3 months (around 02/02/2022) for lab visit for fasting labs.  ? ?Audria Nine, NP ? ? ?

## 2021-11-03 NOTE — Assessment & Plan Note (Signed)
Did review last office note  from previous provider Daniel Lowery ?

## 2021-11-06 ENCOUNTER — Ambulatory Visit (INDEPENDENT_AMBULATORY_CARE_PROVIDER_SITE_OTHER): Payer: BC Managed Care – PPO | Admitting: Cardiology

## 2021-11-06 ENCOUNTER — Encounter: Payer: Self-pay | Admitting: Cardiology

## 2021-11-06 ENCOUNTER — Telehealth: Payer: Self-pay | Admitting: Cardiology

## 2021-11-06 VITALS — BP 142/90 | HR 67 | Ht 67.5 in | Wt 185.0 lb

## 2021-11-06 DIAGNOSIS — R072 Precordial pain: Secondary | ICD-10-CM

## 2021-11-06 DIAGNOSIS — I10 Essential (primary) hypertension: Secondary | ICD-10-CM

## 2021-11-06 DIAGNOSIS — E782 Mixed hyperlipidemia: Secondary | ICD-10-CM | POA: Diagnosis not present

## 2021-11-06 MED ORDER — METOPROLOL TARTRATE 100 MG PO TABS: 100.0000 mg | ORAL_TABLET | Freq: Once | ORAL | 0 refills | Status: DC

## 2021-11-06 NOTE — Telephone Encounter (Signed)
L mom to schedule echocardiogram and 6-8 weeks with Dr. Garen Lah only.

## 2021-11-06 NOTE — Progress Notes (Signed)
?Cardiology Office Note:   ? ?Date:  11/06/2021  ? ?ID:  Daniel QuakerMichael Burack Jr., DOB 1985-07-21, MRN [id number] ? ?PCP:  Eden Emmsable, James M, NP ?  ?[facility] Providers ?Cardiologist:  Debbe OdeaBrian Agbor-Etang, MD    ? ?Referring MD: Pollyann SavoySheldon, Charles B, MD  ? ?Chief Complaint  ?Patient presents with  ? New Patient (Initial Visit)  ?  ED follow up for chest pain. Meds reviewed verbally with patient.   ? ?Daniel QuakerMichael Yale Jr. is a 36 y.o. male who is being seen today for the evaluation of chest pain at the request of Pollyann SavoySheldon, Charles B, MD. ? ? ?History of Present Illness:   ? ?Daniel QuakerMichael Boyajian Jr. is a 36 y.o. male with a hx of hypertension, hyperlipidemia, former smoker x8 years family history of early CAD who presents due to chest pain. ? ?Patient was at home about 2 to 3 weeks ago when he suddenly felt a tightness in his chest.  Also developed left arm numbness which prompted him to go to the ED.  His girlfriend took him to the ED.  He states moving some boxes/building furniture the day before.  Work-up in the ED was unrevealing.  He has a family history of early CAD with dad having heart attacks in his 4140s, requiring quadruple bypass.  Grandfather on dad's side also had an MI.  He has been told his cholesterol is abnormal, his blood pressures at home usually range in the 130s to 140s systolic.  He smoked for 8 years, has not smoked over the past 3 months. ? ?History reviewed. No pertinent past medical history. ? ?Past Surgical History:  ?Procedure Laterality Date  ? MOLE REMOVAL    ? 2 moles on back removed  ? ? ?Current Medications: ?Current Meds  ?Medication Sig  ? famotidine (PEPCID) 10 MG tablet Take 10 mg by mouth 2 (two) times daily.  ? metoprolol tartrate (LOPRESSOR) 100 MG tablet Take 1 tablet (100 mg total) by mouth once for 1 dose. Take 2 hours prior to your CT scan.  ? Omega-3 Fatty Acids (OMEGA-3 CF PO) Take by mouth. Not sure of the dosage  ? Vitamin D, Ergocalciferol, (DRISDOL) 1.25 MG (50000 UNIT) CAPS  capsule Take 1 capsule (50,000 Units total) by mouth every 7 (seven) days.  ?  ? ?Allergies:   Patient has no known allergies.  ? ?Social History  ? ?Socioeconomic History  ? Marital status: Single  ?  Spouse name: Not on file  ? Number of children: 1  ? Years of education: Not on file  ? Highest education level: High school graduate  ?Occupational History  ? Not on file  ?Tobacco Use  ? Smoking status: Never  ? Smokeless tobacco: Current  ?  Types: Chew, Snuff  ?Substance and Sexual Activity  ? Alcohol use: Not Currently  ?  Comment: 24 to 36 cans of beer a week about 2 months ago quit drinking  ? Drug use: Never  ? Sexual activity: Not on file  ?Other Topics Concern  ? Not on file  ?Social History Narrative  ? Fulltime: Delivery driver  ? ?Social Determinants of Health  ? ?Financial Resource Strain: Not on file  ?Food Insecurity: Not on file  ?Transportation Needs: Not on file  ?Physical Activity: Not on file  ?Stress: Not on file  ?Social Connections: Not on file  ?  ? ?Family History: ?The patient's family history includes Cancer (age of onset: 7349) in his mother; Diabetes in his maternal grandmother; Heart  attack (age of onset: 58) in his father; Heart disease in his father and paternal grandfather; Hypertension in his father and paternal grandmother; Other in his father. ? ?ROS:   ?Please see the history of present illness.    ? All other systems reviewed and are negative. ? ?EKGs/Labs/Other Studies Reviewed:   ? ?The following studies were reviewed today: ? ? ?EKG:  EKG is  ordered today.  The ekg ordered today demonstrates normal sinus rhythm with sinus arrhythmia ? ?Recent Labs: ?08/29/2021: ALT 33; TSH 1.760 ?10/09/2021: BUN 6; Creatinine, Ser 0.98; Hemoglobin 16.1; Platelets 259; Potassium 3.7; Sodium 138  ?Recent Lipid Panel ?   ?Component Value Date/Time  ? CHOL 234 (H) 08/29/2021 1052  ? TRIG 456 (H) 08/29/2021 1052  ? HDL 39 (L) 08/29/2021 1052  ? CHOLHDL 6.0 (H) 08/29/2021 1052  ? LDLCALC 115 (H)  08/29/2021 1052  ? ? ? ?Risk Assessment/Calculations:   ? ? ?    ? ?Physical Exam:   ? ?VS:  BP (!) 142/90 (BP Location: Left Arm, Patient Position: Sitting, Cuff Size: Normal)   Pulse 67   Ht 5' 7.5" (1.715 m)   Wt 185 lb (83.9 kg)   SpO2 98%   BMI 28.55 kg/m?    ? ?Wt Readings from Last 3 Encounters:  ?11/06/21 185 lb (83.9 kg)  ?11/03/21 180 lb (81.6 kg)  ?09/20/21 191 lb 6.4 oz (86.8 kg)  ?  ? ?GEN:  Well nourished, well developed in no acute distress ?HEENT: Normal ?NECK: No JVD; No carotid bruits ?LYMPHATICS: No lymphadenopathy ?CARDIAC: RRR, no murmurs, rubs, gallops ?RESPIRATORY:  Clear to auscultation without rales, wheezing or rhonchi  ?ABDOMEN: Soft, non-tender, non-distended ?MUSCULOSKELETAL:  No edema; No deformity  ?SKIN: Warm and dry ?NEUROLOGIC:  Alert and oriented x 3 ?PSYCHIATRIC:  Normal affect  ? ?ASSESSMENT:   ? ?1. Precordial pain   ?2. Primary hypertension   ?3. Mixed hyperlipidemia   ? ?PLAN:   ? ?In order of problems listed above: ? ?Chest pain, risk factors hypertension, hyperlipidemia, family history of early CAD.  Get echocardiogram, get coronary CTA. ?Hypertension, pending work-up above, will start BP medication. ?Hyperlipidemia, low-cholesterol diet advised.  Consider statin at follow-up visit (CCTA work-up), family history of early CAD.  At least moderate intensity statin. ? ?Follow-up after cardiac testing. ? ?   ? ? ?Medication Adjustments/Labs and Tests Ordered: ?Current medicines are reviewed at length with the patient today.  Concerns regarding medicines are outlined above.  ?Orders Placed This Encounter  ?Procedures  ? CT CORONARY MORPH W/CTA COR W/SCORE W/CA W/CM &/OR WO/CM  ? Basic metabolic panel  ? EKG 12-Lead  ? ECHOCARDIOGRAM COMPLETE  ? ?Meds ordered this encounter  ?Medications  ? metoprolol tartrate (LOPRESSOR) 100 MG tablet  ?  Sig: Take 1 tablet (100 mg total) by mouth once for 1 dose. Take 2 hours prior to your CT scan.  ?  Dispense:  1 tablet  ?  Refill:  0   ? ? ?Patient Instructions  ?Medication Instructions:  ? ?Your physician recommends that you continue on your current medications as directed. Please refer to the Current Medication list given to you today. ? ?*If you need a refill on your cardiac medications before your next appointment, please call your pharmacy* ? ? ?Lab Work: ? ?BMP to be drawn in lab today. ? ?If you have labs (blood work) drawn today and your tests are completely normal, you will receive your results only by: ?MyChart Message (if you  have MyChart) OR ?A paper copy in the mail ?If you have any lab test that is abnormal or we need to change your treatment, we will call you to review the results. ? ? ?Testing/Procedures: ? ?Your physician has requested that you have an echocardiogram. Echocardiography is a painless test that uses sound waves to create images of your heart. It provides your doctor with information about the size and shape of your heart and how well your heart?s chambers and valves are working. This procedure takes approximately one hour. There are no restrictions for this procedure. ? ?2.   Your physician has requested that you have cardiac CT. Cardiac computed tomography (CT) is a painless test that uses an x-ray machine to take clear, detailed pictures of your heart.  ? ?Your cardiac CT will be scheduled at: ? ?[facility] ?[address] ?[address] ?[place], [place] [address] ?[phone] ? ?Please arrive 15 mins early for check-in and test prep. ? ? ? ?Please follow these instructions carefully (unless otherwise directed): ? ? ? ?On the Night Before the Test: ?Be sure to Drink plenty of water. ?Do not consume any caffeinated/decaffeinated beverages or chocolate 12 hours prior to your test. ? ? ?On the Day of the Test: ?Drink plenty of water until 1 hour prior to the test. ?Do not eat any food 4 hours prior to the test. ?You may take your regular medications prior to the test.  ?Take  metoprolol (Lopressor) 100 MG two hours prior to test. ? ? ? ?After the Test: ?Drink plenty of water. ?After receiving IV contrast, you may experience a mild flushed feeling. This is normal. ?On occasion, you may experience a mild

## 2021-11-06 NOTE — Patient Instructions (Addendum)
Medication Instructions:  ? ?Your physician recommends that you continue on your current medications as directed. Please refer to the Current Medication list given to you today. ? ?*If you need a refill on your cardiac medications before your next appointment, please call your pharmacy* ? ? ?Lab Work: ? ?BMP to be drawn in lab today. ? ?If you have labs (blood work) drawn today and your tests are completely normal, you will receive your results only by: ?MyChart Message (if you have MyChart) OR ?A paper copy in the mail ?If you have any lab test that is abnormal or we need to change your treatment, we will call you to review the results. ? ? ?Testing/Procedures: ? ?Your physician has requested that you have an echocardiogram. Echocardiography is a painless test that uses sound waves to create images of your heart. It provides your doctor with information about the size and shape of your heart and how well your heart?s chambers and valves are working. This procedure takes approximately one hour. There are no restrictions for this procedure. ? ?2.   Your physician has requested that you have cardiac CT. Cardiac computed tomography (CT) is a painless test that uses an x-ray machine to take clear, detailed pictures of your heart.  ? ?Your cardiac CT will be scheduled at: ? ?[facility] [address] ?[address] ?[place], [place] [address] ?[phone] ? ?Please arrive 15 mins early for check-in and test prep. ? ? ? ?Please follow these instructions carefully (unless otherwise directed): ? ? ? ?On the Night Before the Test: ?Be sure to Drink plenty of water. ?Do not consume any caffeinated/decaffeinated beverages or chocolate 12 hours prior to your test. ? ? ?On the Day of the Test: ?Drink plenty of water until 1 hour prior to the test. ?Do not eat any food 4 hours prior to the test. ?You may take your regular medications prior to the test.  ?Take metoprolol (Lopressor) 100 MG two  hours prior to test. ? ? ? ?After the Test: ?Drink plenty of water. ?After receiving IV contrast, you may experience a mild flushed feeling. This is normal. ?On occasion, you may experience a mild rash up to 24 hours after the test. This is not dangerous. If this occurs, you can take Benadryl 25 mg and increase your fluid intake. ?If you experience trouble breathing, this can be serious. If it is severe call 911 IMMEDIATELY. If it is mild, please call our office. ?If you take any of these medications: Glipizide/Metformin, Avandament, Glucavance, please do not take 48 hours after completing test unless otherwise instructed. ? ?Please allow 2-4 weeks for scheduling of routine cardiac CTs. Some insurance companies require a pre-authorization which may delay scheduling of this test.  ? ?For non-scheduling related questions, please contact the cardiac imaging nurse navigator should you have any questions/concerns: ?Marchia Bond, Cardiac Imaging Nurse Navigator ?Gordy Clement, Cardiac Imaging Nurse Navigator ?[facility] ?Direct Office Dial: [phone]  ? ?For scheduling needs, including cancellations and rescheduling, please call [place], [phone].   ? ? ? ?Follow-Up: ?At [facility], you and your health needs are our priority.  As part of our continuing mission to provide you with exceptional heart care, we have created designated Provider Care Teams.  These Care Teams include your primary Cardiologist (physician) and Advanced Practice Providers (APPs -  Physician Assistants and Nurse Practitioners) who all work together to provide you with the care you need, when you need it. ? ?  We recommend signing up for the patient portal called "MyChart".  Sign up information is provided on this After Visit Summary.  MyChart is used to connect with patients for Virtual Visits (Telemedicine).  Patients are able to view lab/test results, encounter notes, upcoming appointments, etc.  Non-urgent  messages can be sent to your provider as well.   ?To learn more about what you can do with MyChart, go to [email or web address].   ? ?Your next appointment:   ?Follow up 6-8 weeks ? ?The format for your next appointment:   ?In Person ? ?Provider:   ? ?Dr. Garen Lah ONLY ? ?Other Instructions ? ? ?Important Information About Sugar ? ? ? ? ? ? ?

## 2021-11-07 LAB — BASIC METABOLIC PANEL
BUN/Creatinine Ratio: 13 (ref 9–20)
BUN: 11 mg/dL (ref 6–20)
CO2: 21 mmol/L (ref 20–29)
Calcium: 9.2 mg/dL (ref 8.7–10.2)
Chloride: 106 mmol/L (ref 96–106)
Creatinine, Ser: 0.87 mg/dL (ref 0.76–1.27)
Glucose: 100 mg/dL — ABNORMAL HIGH (ref 70–99)
Potassium: 4.5 mmol/L (ref 3.5–5.2)
Sodium: 143 mmol/L (ref 134–144)
eGFR: 117 mL/min/{1.73_m2} (ref >59)

## 2021-11-10 ENCOUNTER — Telehealth (HOSPITAL_COMMUNITY): Payer: Self-pay | Admitting: Emergency Medicine

## 2021-11-10 NOTE — Telephone Encounter (Signed)
Reaching out to patient to offer assistance regarding upcoming cardiac imaging study; pt verbalizes understanding of appt date/time, parking situation and where to check in, pre-test NPO status and medications ordered, and verified current allergies; name and call back number provided for further questions should they arise ?Marchia Bond RN Navigator Cardiac Imaging ?[facility] ?[phone] office ?[phone] cell ? ?Denies iv issues ?100mg  metoprolol tartrate ?Arrival 745 ?

## 2021-11-13 ENCOUNTER — Ambulatory Visit
Admission: RE | Admit: 2021-11-13 | Discharge: 2021-11-13 | Disposition: A | Discharge status: Home / Self Care | Payer: BC Managed Care – PPO | Source: Ambulatory Visit | Attending: Cardiology | Admitting: Cardiology

## 2021-11-13 DIAGNOSIS — R072 Precordial pain: Secondary | ICD-10-CM

## 2021-11-13 MED ORDER — IOHEXOL 350 MG/ML SOLN
75.0000 mL | Freq: Once | INTRAVENOUS | Status: AC | PRN
  Administered 2021-11-13: 75 mL via INTRAVENOUS

## 2021-11-13 MED ORDER — NITROGLYCERIN 0.4 MG SL SUBL
0.8000 mg | SUBLINGUAL_TABLET | Freq: Once | SUBLINGUAL | Status: AC
Start: 2021-11-13 — End: 2021-11-13
  Administered 2021-11-13: 0.8 mg via SUBLINGUAL

## 2021-11-13 NOTE — Progress Notes (Signed)
Patient tolerated procedure well. Ambulate w/o difficulty. Denies light headedness or being dizzy. Sitting in chair drinking water provided. Encouraged to drink extra water today and reasoning explained. Verbalized understanding. All questions answered. ABC intact. No further needs. Discharge from procedure area w/o issues.   °

## 2021-11-14 ENCOUNTER — Encounter: Payer: Self-pay | Admitting: *Deleted

## 2021-11-24 ENCOUNTER — Ambulatory Visit (INDEPENDENT_AMBULATORY_CARE_PROVIDER_SITE_OTHER): Payer: BC Managed Care – PPO

## 2021-11-24 DIAGNOSIS — R072 Precordial pain: Secondary | ICD-10-CM

## 2021-11-24 LAB — ECHOCARDIOGRAM COMPLETE
AR max vel: 4.42 cm2
AV Area VTI: 4.25 cm2
AV Area mean vel: 4.14 cm2
AV Mean grad: 3 mmHg
AV Peak grad: 4.7 mmHg
Ao pk vel: 1.08 m/s
Area-P 1/2: 3.19 cm2
Calc EF: 56.2 %
S' Lateral: 3.5 cm
Single Plane A2C EF: 55.7 %
Single Plane A4C EF: 55.4 %

## 2021-12-01 ENCOUNTER — Encounter: Payer: Self-pay | Admitting: Cardiology

## 2021-12-01 ENCOUNTER — Ambulatory Visit: Payer: BC Managed Care – PPO | Admitting: Cardiology

## 2021-12-01 VITALS — BP 138/78 | HR 91 | Ht 67.5 in | Wt 181.4 lb

## 2021-12-01 DIAGNOSIS — E782 Mixed hyperlipidemia: Secondary | ICD-10-CM | POA: Diagnosis not present

## 2021-12-01 DIAGNOSIS — I10 Essential (primary) hypertension: Secondary | ICD-10-CM | POA: Diagnosis not present

## 2021-12-01 DIAGNOSIS — R072 Precordial pain: Secondary | ICD-10-CM | POA: Diagnosis not present

## 2021-12-01 MED ORDER — ATORVASTATIN CALCIUM 40 MG PO TABS: 40.0000 mg | ORAL_TABLET | Freq: Every day | ORAL | 3 refills | Status: DC

## 2021-12-01 MED ORDER — LOSARTAN POTASSIUM 25 MG PO TABS: 25.0000 mg | ORAL_TABLET | Freq: Every day | ORAL | 3 refills | Status: DC

## 2021-12-01 NOTE — Patient Instructions (Signed)
Medication Instructions:  Your physician has recommended you make the following change in your medication:   -START Atorvastatin 40 mg 1 tablet by mouth daily.  -START Losartan 25 mg 1 tablet by mouth daily.  *If you need a refill on your cardiac medications before your next appointment, please call your pharmacy*   Lab Work:  Your physician recommends that you return for lab work in: LIPID in 6 wks at the [facility]  Make sure you are fasting you can have Black coffee and/or water.  If you have labs (blood work) drawn today and your tests are completely normal, you will receive your results only by: MyChart Message (if you have MyChart) OR A paper copy in the mail If you have any lab test that is abnormal or we need to change your treatment, we will call you to review the results.   Testing/Procedures:  NONE   Follow-Up: At [facility], you and your health needs are our priority.  As part of our continuing mission to provide you with exceptional heart care, we have created designated Provider Care Teams.  These Care Teams include your primary Cardiologist (physician) and Advanced Practice Providers (APPs -  Physician Assistants and Nurse Practitioners) who all work together to provide you with the care you need, when you need it.  We recommend signing up for the patient portal called "MyChart".  Sign up information is provided on this After Visit Summary.  MyChart is used to connect with patients for Virtual Visits (Telemedicine).  Patients are able to view lab/test results, encounter notes, upcoming appointments, etc.  Non-urgent messages can be sent to your provider as well.   To learn more about what you can do with MyChart, go to [email or web address].    Your next appointment:   6 week(s)  The format for your next appointment:   In Person  Provider:   You may see Debbe Odea, MD or one of the following Advanced Practice Providers on your designated Care  Team:   Nicolasa Ducking, NP Eula Listen, PA-C Cadence Fransico Humberto, [place]    Important Information About Sugar

## 2021-12-01 NOTE — Progress Notes (Signed)
Cardiology Office Note:    Date:  12/01/2021   ID:  Daniel QuakerMichael Rogala Jr., DOB 1986/04/25, MRN [id number]  PCP:  Eden Emmsable, James M, NP   [facility] Providers Cardiologist:  Debbe OdeaBrian Agbor-Etang, MD     Referring MD: Eden Emmsable, James M, NP   Chief Complaint  Patient presents with   Other    F/u cardiac testing no complaints today. Meds reviewed verbally with pt.    History of Present Illness:    Daniel QuakerMichael Cihlar Jr. is a 36 y.o. male with a hx of hypertension, hyperlipidemia, former smoker x8 years family history of early CAD who presents for follow-up.  he was previously seen due to chest pain.  Due to strong family history, risk factors and symptoms echo and coronary CTA was obtained to evaluate presence of CAD.  He states doing okay, presents today for testing results.  Prior notes He has a family history of early CAD with dad having heart attacks in his 4640s, requiring quadruple bypass.  Grandfather on dad's side also had an MI.   History reviewed. No pertinent past medical history.  Past Surgical History:  Procedure Laterality Date   MOLE REMOVAL     2 moles on back removed    Current Medications: Current Meds  Medication Sig   atorvastatin (LIPITOR) 40 MG tablet Take 1 tablet (40 mg total) by mouth daily.   famotidine (PEPCID) 10 MG tablet Take 10 mg by mouth 2 (two) times daily.   losartan (COZAAR) 25 MG tablet Take 1 tablet (25 mg total) by mouth daily.   metoprolol tartrate (LOPRESSOR) 100 MG tablet Take 1 tablet (100 mg total) by mouth once for 1 dose. Take 2 hours prior to your CT scan.   Omega-3 Fatty Acids (OMEGA-3 CF PO) Take by mouth. Not sure of the dosage   Vitamin D, Ergocalciferol, (DRISDOL) 1.25 MG (50000 UNIT) CAPS capsule Take 1 capsule (50,000 Units total) by mouth every 7 (seven) days.     Allergies:   Patient has no known allergies.   Social History   Socioeconomic History   Marital status: Single    Spouse name: Not on file   Number of children: 1    Years of education: Not on file   Highest education level: High school graduate  Occupational History   Not on file  Tobacco Use   Smoking status: Never   Smokeless tobacco: Current    Types: Chew, Snuff  Substance and Sexual Activity   Alcohol use: Not Currently    Comment: 24 to 36 cans of beer a week about 2 months ago quit drinking   Drug use: Never   Sexual activity: Not on file  Other Topics Concern   Not on file  Social History Narrative   Fulltime: Delivery driver   Social Determinants of Health   Financial Resource Strain: Not on file  Food Insecurity: Not on file  Transportation Needs: Not on file  Physical Activity: Not on file  Stress: Not on file  Social Connections: Not on file     Family History: The patient's family history includes Cancer (age of onset: 7149) in his mother; Diabetes in his maternal grandmother; Heart attack (age of onset: 8340) in his father; Heart disease in his father and paternal grandfather; Hypertension in his father and paternal grandmother; Other in his father.  ROS:   Please see the history of present illness.     All other systems reviewed and are negative.  EKGs/Labs/Other Studies Reviewed:  The following studies were reviewed today:   EKG:  EKG not ordered today.    Recent Labs: 08/29/2021: ALT 33; TSH 1.760 10/09/2021: Hemoglobin 16.1; Platelets 259 11/06/2021: BUN 11; Creatinine, Ser 0.87; Potassium 4.5; Sodium 143  Recent Lipid Panel    Component Value Date/Time   CHOL 234 (H) 08/29/2021 1052   TRIG 456 (H) 08/29/2021 1052   HDL 39 (L) 08/29/2021 1052   CHOLHDL 6.0 (H) 08/29/2021 1052   LDLCALC 115 (H) 08/29/2021 1052     Risk Assessment/Calculations:          Physical Exam:    VS:  BP 138/78 (BP Location: Left Arm, Patient Position: Sitting, Cuff Size: Normal)   Pulse 91   Ht 5' 7.5" (1.715 m)   Wt 181 lb 6 oz (82.3 kg)   SpO2 96%   BMI 27.99 kg/m     Wt Readings from Last 3 Encounters:  12/01/21  181 lb 6 oz (82.3 kg)  11/06/21 185 lb (83.9 kg)  11/03/21 180 lb (81.6 kg)     GEN:  Well nourished, well developed in no acute distress HEENT: Normal NECK: No JVD; No carotid bruits LYMPHATICS: No lymphadenopathy CARDIAC: RRR, no murmurs, rubs, gallops RESPIRATORY:  Clear to auscultation without rales, wheezing or rhonchi  ABDOMEN: Soft, non-tender, non-distended MUSCULOSKELETAL:  No edema; No deformity  SKIN: Warm and dry NEUROLOGIC:  Alert and oriented x 3 PSYCHIATRIC:  Normal affect   ASSESSMENT:    1. Precordial pain   2. Primary hypertension   3. Mixed hyperlipidemia     PLAN:    In order of problems listed above:  Chest pain, risk factors hypertension, hyperlipidemia, family history of early CAD.  Echo was normal EF 55 to 60%.  Coronary CTA with no CAD, calcium score 0.  Patient made aware of results, reassured. Hypertension, stage I per ACC AHA definition.  Start losartan 25 mg daily Hyperlipidemia, low-cholesterol diet advised.  Start Lipitor 40 mg daily.  Repeat lipid panel in 6 weeks  Follow-up in 6 weeks.      Medication Adjustments/Labs and Tests Ordered: Current medicines are reviewed at length with the patient today.  Concerns regarding medicines are outlined above.  Orders Placed This Encounter  Procedures   Lipid panel   Meds ordered this encounter  Medications   losartan (COZAAR) 25 MG tablet    Sig: Take 1 tablet (25 mg total) by mouth daily.    Dispense:  90 tablet    Refill:  3   atorvastatin (LIPITOR) 40 MG tablet    Sig: Take 1 tablet (40 mg total) by mouth daily.    Dispense:  90 tablet    Refill:  3    Patient Instructions  Medication Instructions:  Your physician has recommended you make the following change in your medication:   -START Atorvastatin 40 mg 1 tablet by mouth daily.  -START Losartan 25 mg 1 tablet by mouth daily.  *If you need a refill on your cardiac medications before your next appointment, please call your  pharmacy*   Lab Work:  Your physician recommends that you return for lab work in: LIPID in 6 wks at the [facility]  Make sure you are fasting you can have Black coffee and/or water.  If you have labs (blood work) drawn today and your tests are completely normal, you will receive your results only by: MyChart Message (if you have MyChart) OR A paper copy in the mail If you have any lab test  that is abnormal or we need to change your treatment, we will call you to review the results.   Testing/Procedures:  NONE   Follow-Up: At [facility], you and your health needs are our priority.  As part of our continuing mission to provide you with exceptional heart care, we have created designated Provider Care Teams.  These Care Teams include your primary Cardiologist (physician) and Advanced Practice Providers (APPs -  Physician Assistants and Nurse Practitioners) who all work together to provide you with the care you need, when you need it.  We recommend signing up for the patient portal called "MyChart".  Sign up information is provided on this After Visit Summary.  MyChart is used to connect with patients for Virtual Visits (Telemedicine).  Patients are able to view lab/test results, encounter notes, upcoming appointments, etc.  Non-urgent messages can be sent to your provider as well.   To learn more about what you can do with MyChart, go to [email or web address].    Your next appointment:   6 week(s)  The format for your next appointment:   In Person  Provider:   You may see Debbe Odea, MD or one of the following Advanced Practice Providers on your designated Care Team:   Nicolasa Ducking, NP Eula Listen, PA-C Cadence Fransico Marc, [place]    Important Information About Sugar         Signed, Debbe Odea, MD  12/01/2021 10:03 AM    [facility]

## 2021-12-20 ENCOUNTER — Ambulatory Visit: Payer: BC Managed Care – PPO | Admitting: Nurse Practitioner

## 2022-01-01 DIAGNOSIS — R221 Localized swelling, mass and lump, neck: Secondary | ICD-10-CM | POA: Diagnosis not present

## 2022-01-19 ENCOUNTER — Other Ambulatory Visit
Admission: RE | Admit: 2022-01-19 | Discharge: 2022-01-19 | Disposition: A | Discharge status: Home / Self Care | Payer: BC Managed Care – PPO | Attending: Cardiology | Admitting: Cardiology

## 2022-01-19 DIAGNOSIS — E782 Mixed hyperlipidemia: Secondary | ICD-10-CM | POA: Insufficient documentation

## 2022-01-19 LAB — LIPID PANEL
Cholesterol: 89 mg/dL (ref 0–200)
HDL: 22 mg/dL — ABNORMAL LOW (ref >40)
LDL Cholesterol: 41 mg/dL (ref 0–99)
Total CHOL/HDL Ratio: 4 RATIO
Triglycerides: 132 mg/dL (ref <150)
VLDL: 26 mg/dL (ref 0–40)

## 2022-01-22 ENCOUNTER — Encounter: Payer: Self-pay | Admitting: Cardiology

## 2022-01-22 ENCOUNTER — Ambulatory Visit: Payer: BC Managed Care – PPO | Admitting: Cardiology

## 2022-01-22 VITALS — BP 124/80 | HR 80 | Ht 67.0 in | Wt 182.5 lb

## 2022-01-22 DIAGNOSIS — E782 Mixed hyperlipidemia: Secondary | ICD-10-CM

## 2022-01-22 DIAGNOSIS — I10 Essential (primary) hypertension: Secondary | ICD-10-CM

## 2022-01-22 MED ORDER — ATORVASTATIN CALCIUM 40 MG PO TABS
40.0000 mg | ORAL_TABLET | Freq: Every day | ORAL | 3 refills | Status: DC
Start: 2022-01-22 — End: 2023-05-15

## 2022-01-22 MED ORDER — LOSARTAN POTASSIUM 25 MG PO TABS: 25.0000 mg | ORAL_TABLET | Freq: Every day | ORAL | 3 refills | Status: DC

## 2022-01-22 NOTE — Patient Instructions (Signed)
Medication Instructions:   Your physician recommends that you continue on your current medications as directed. Please refer to the Current Medication list given to you today.  *If you need a refill on your cardiac medications before your next appointment, please call your pharmacy*   Follow-Up: At [facility], you and your health needs are our priority.  As part of our continuing mission to provide you with exceptional heart care, we have created designated Provider Care Teams.  These Care Teams include your primary Cardiologist (physician) and Advanced Practice Providers (APPs -  Physician Assistants and Nurse Practitioners) who all work together to provide you with the care you need, when you need it.  We recommend signing up for the patient portal called "MyChart".  Sign up information is provided on this After Visit Summary.  MyChart is used to connect with patients for Virtual Visits (Telemedicine).  Patients are able to view lab/test results, encounter notes, upcoming appointments, etc.  Non-urgent messages can be sent to your provider as well.   To learn more about what you can do with MyChart, go to [email or web address].    Your next appointment:    Follow up as needed   The format for your next appointment:   In Person  Provider:   Brian Agbor-Etang, MD    Other Instructions   Important Information About Sugar       

## 2022-01-22 NOTE — Progress Notes (Signed)
Cardiology Office Note:    Date:  01/22/2022   ID:  [name]., DOB Jun 11, 1986, MRN [id number]  PCP:  Eden Emms, NP   [facility] Providers Cardiologist:  Debbe Odea, MD     Referring MD: Eden Emms, NP   Chief Complaint  Patient presents with   Other    6 wk f/u no complaints today. Meds reviewed verbally with pt.    History of Present Illness:    Daniel Lowery. is a 36 y.o. male with a hx of hypertension, hyperlipidemia, former smoker x8 years family history of early CAD who presents for follow-up.    Previously seen for chest pain, work-up with echo and coronary CTA were unrevealing.  Cholesterol levels were abnormal, blood pressure also elevated.  Started on losartan and Lipitor, tolerating medications without any adverse effects.  Obtain lipid panel 6 weeks after starting medications.  Presents for testing results, feels well, has no concerns at this time.   Prior notes Echo 11/24/2021, normal systolic and diastolic function, EF 55 to 60% Coronary CTA 11/13/2021, calcium score 0, no evidence of CAD He has a family history of early CAD with dad having heart attacks in his 45s, requiring quadruple bypass.  Grandfather on dad's side also had an MI.   History reviewed. No pertinent past medical history.  Past Surgical History:  Procedure Laterality Date   MOLE REMOVAL     2 moles on back removed    Current Medications: Current Meds  Medication Sig   famotidine (PEPCID) 10 MG tablet Take 10 mg by mouth 2 (two) times daily.   Omega-3 Fatty Acids (OMEGA-3 CF PO) Take by mouth. Not sure of the dosage   Vitamin D, Ergocalciferol, (DRISDOL) 1.25 MG (50000 UNIT) CAPS capsule Take 1 capsule (50,000 Units total) by mouth every 7 (seven) days.   [DISCONTINUED] atorvastatin (LIPITOR) 40 MG tablet Take 1 tablet (40 mg total) by mouth daily.   [DISCONTINUED] losartan (COZAAR) 25 MG tablet Take 1 tablet (25 mg total) by mouth daily.     Allergies:    Patient has no known allergies.   Social History   Socioeconomic History   Marital status: Single    Spouse name: Not on file   Number of children: 1   Years of education: Not on file   Highest education level: High school graduate  Occupational History   Not on file  Tobacco Use   Smoking status: Never   Smokeless tobacco: Current    Types: Chew, Snuff  Substance and Sexual Activity   Alcohol use: Not Currently    Comment: 24 to 36 cans of beer a week about 2 months ago quit drinking   Drug use: Never   Sexual activity: Not on file  Other Topics Concern   Not on file  Social History Narrative   Fulltime: Delivery driver   Social Determinants of Health   Financial Resource Strain: Not on file  Food Insecurity: Not on file  Transportation Needs: Not on file  Physical Activity: Not on file  Stress: Not on file  Social Connections: Not on file     Family History: The patient's family history includes Cancer (age of onset: 41) in his mother; Diabetes in his maternal grandmother; Heart attack (age of onset: 12) in his father; Heart disease in his father and paternal grandfather; Hypertension in his father and paternal grandmother; Other in his father.  ROS:   Please see the history of present illness.  All other systems reviewed and are negative.  EKGs/Labs/Other Studies Reviewed:    The following studies were reviewed today:   EKG:  EKG not ordered today.    Recent Labs: 08/29/2021: ALT 33; TSH 1.760 10/09/2021: Hemoglobin 16.1; Platelets 259 11/06/2021: BUN 11; Creatinine, Ser 0.87; Potassium 4.5; Sodium 143  Recent Lipid Panel    Component Value Date/Time   CHOL 89 01/19/2022 0837   CHOL 234 (H) 08/29/2021 1052   TRIG 132 01/19/2022 0837   HDL 22 (L) 01/19/2022 0837   HDL 39 (L) 08/29/2021 1052   CHOLHDL 4.0 01/19/2022 0837   VLDL 26 01/19/2022 0837   LDLCALC 41 01/19/2022 0837   LDLCALC 115 (H) 08/29/2021 1052     Risk Assessment/Calculations:           Physical Exam:    VS:  BP 124/80 (BP Location: Left Arm, Patient Position: Sitting, Cuff Size: Normal)   Pulse 80   Ht 5\' 7"  (1.702 m)   Wt 182 lb 8 oz (82.8 kg)   SpO2 99%   BMI 28.58 kg/m     Wt Readings from Last 3 Encounters:  01/22/22 182 lb 8 oz (82.8 kg)  12/01/21 181 lb 6 oz (82.3 kg)  11/06/21 185 lb (83.9 kg)     GEN:  Well nourished, well developed in no acute distress HEENT: Normal NECK: No JVD; No carotid bruits CARDIAC: RRR, no murmurs, rubs, gallops RESPIRATORY:  Clear to auscultation without rales, wheezing or rhonchi  ABDOMEN: Soft, non-tender, non-distended MUSCULOSKELETAL:  No edema; No deformity  SKIN: Warm and dry NEUROLOGIC:  Alert and oriented x 3 PSYCHIATRIC:  Normal affect   ASSESSMENT:    1. Primary hypertension   2. Mixed hyperlipidemia    PLAN:    In order of problems listed above:  Hypertension, BP now controlled, continue losartan 25 mg daily Hyperlipidemia, cholesterol now controlled.  Continue Lipitor 40 mg daily.   Follow-up as needed      Medication Adjustments/Labs and Tests Ordered: Current medicines are reviewed at length with the patient today.  Concerns regarding medicines are outlined above.  No orders of the defined types were placed in this encounter.  Meds ordered this encounter  Medications   atorvastatin (LIPITOR) 40 MG tablet    Sig: Take 1 tablet (40 mg total) by mouth daily.    Dispense:  90 tablet    Refill:  3   losartan (COZAAR) 25 MG tablet    Sig: Take 1 tablet (25 mg total) by mouth daily.    Dispense:  90 tablet    Refill:  3    Patient Instructions  Medication Instructions:   Your physician recommends that you continue on your current medications as directed. Please refer to the Current Medication list given to you today.   *If you need a refill on your cardiac medications before your next appointment, please call your pharmacy*    Follow-Up: At [facility], you and your  health needs are our priority.  As part of our continuing mission to provide you with exceptional heart care, we have created designated Provider Care Teams.  These Care Teams include your primary Cardiologist (physician) and Advanced Practice Providers (APPs -  Physician Assistants and Nurse Practitioners) who all work together to provide you with the care you need, when you need it.  We recommend signing up for the patient portal called "MyChart".  Sign up information is provided on this After Visit Summary.  MyChart is used to connect  with patients for Virtual Visits (Telemedicine).  Patients are able to view lab/test results, encounter notes, upcoming appointments, etc.  Non-urgent messages can be sent to your provider as well.   To learn more about what you can do with MyChart, go to [email or web address].    Your next appointment:   Follow up as needed   The format for your next appointment:   In Person  Provider:   Debbe Odea, MD    Other Instructions   Important Information About Sugar         Signed, Debbe Odea, MD  01/22/2022 9:24 AM    [facility]

## 2022-02-02 ENCOUNTER — Other Ambulatory Visit: Payer: BC Managed Care – PPO

## 2022-07-13 DIAGNOSIS — R221 Localized swelling, mass and lump, neck: Secondary | ICD-10-CM | POA: Diagnosis not present

## 2022-07-17 ENCOUNTER — Other Ambulatory Visit: Payer: Self-pay | Admitting: Unknown Physician Specialty

## 2022-07-17 DIAGNOSIS — R221 Localized swelling, mass and lump, neck: Secondary | ICD-10-CM

## 2022-07-26 ENCOUNTER — Ambulatory Visit
Admission: RE | Admit: 2022-07-26 | Discharge: 2022-07-26 | Disposition: A | Discharge status: Home / Self Care | Payer: BC Managed Care – PPO | Source: Ambulatory Visit | Attending: Unknown Physician Specialty | Admitting: Unknown Physician Specialty

## 2022-07-26 DIAGNOSIS — R221 Localized swelling, mass and lump, neck: Secondary | ICD-10-CM | POA: Diagnosis not present

## 2022-09-12 ENCOUNTER — Encounter: Payer: BC Managed Care – PPO | Admitting: Nurse Practitioner

## 2022-10-22 DIAGNOSIS — R221 Localized swelling, mass and lump, neck: Secondary | ICD-10-CM | POA: Diagnosis not present

## 2022-10-22 DIAGNOSIS — K219 Gastro-esophageal reflux disease without esophagitis: Secondary | ICD-10-CM | POA: Diagnosis not present

## 2022-11-08 ENCOUNTER — Encounter (HOSPITAL_BASED_OUTPATIENT_CLINIC_OR_DEPARTMENT_OTHER): Payer: Self-pay | Admitting: *Deleted

## 2022-11-08 ENCOUNTER — Emergency Department (HOSPITAL_BASED_OUTPATIENT_CLINIC_OR_DEPARTMENT_OTHER): Payer: BC Managed Care – PPO

## 2022-11-08 ENCOUNTER — Emergency Department (HOSPITAL_BASED_OUTPATIENT_CLINIC_OR_DEPARTMENT_OTHER)
Admission: EM | Admit: 2022-11-08 | Discharge: 2022-11-08 | Disposition: A | Discharge status: Home / Self Care | Payer: BC Managed Care – PPO | Attending: Emergency Medicine | Admitting: Emergency Medicine

## 2022-11-08 ENCOUNTER — Other Ambulatory Visit: Payer: Self-pay

## 2022-11-08 DIAGNOSIS — Y990 Civilian activity done for income or pay: Secondary | ICD-10-CM | POA: Insufficient documentation

## 2022-11-08 DIAGNOSIS — S86912A Strain of unspecified muscle(s) and tendon(s) at lower leg level, left leg, initial encounter: Secondary | ICD-10-CM | POA: Insufficient documentation

## 2022-11-08 DIAGNOSIS — X501XXA Overexertion from prolonged static or awkward postures, initial encounter: Secondary | ICD-10-CM | POA: Diagnosis not present

## 2022-11-08 DIAGNOSIS — M25562 Pain in left knee: Secondary | ICD-10-CM | POA: Diagnosis not present

## 2022-11-08 DIAGNOSIS — S8992XA Unspecified injury of left lower leg, initial encounter: Secondary | ICD-10-CM | POA: Diagnosis not present

## 2022-11-08 DIAGNOSIS — S86812A Strain of other muscle(s) and tendon(s) at lower leg level, left leg, initial encounter: Secondary | ICD-10-CM | POA: Diagnosis not present

## 2022-11-08 NOTE — ED Triage Notes (Signed)
Pt is here for left knee injury. Pt had his knee injured between equipment and it twisted and is painful now and difficult to make certain movements.

## 2022-11-08 NOTE — ED Provider Notes (Signed)
[place] EMERGENCY DEPARTMENT AT [facility] Provider Note   CSN: [id number] Arrival date & time: 11/08/22  1518     History  Chief Complaint  Patient presents with   Knee Injury    Daniel Lowery. is a 37 y.o. male.  Patient is a 36 year old male who presents with pain in his left knee.  He was at work and his foot got caught between the edge of a pallet and the machine and twisted.  He has been complaining of pain in his knee since that time earlier today.  Denies any other injuries.  No numbness or weakness to the leg.       Home Medications Prior to Admission medications   Medication Sig Start Date End Date Taking? Authorizing Provider  omeprazole (PRILOSEC) 40 MG capsule Take 40 mg by mouth daily. 10/22/22  Yes [provider]  atorvastatin (LIPITOR) 40 MG tablet Take 1 tablet (40 mg total) by mouth daily. 01/22/22 01/17/23  Debbe Odea, MD  famotidine (PEPCID) 10 MG tablet Take 10 mg by mouth 2 (two) times daily.    [provider]  losartan (COZAAR) 25 MG tablet Take 1 tablet (25 mg total) by mouth daily. 01/22/22 01/17/23  Debbe Odea, MD  Omega-3 Fatty Acids (OMEGA-3 CF PO) Take by mouth. Not sure of the dosage    [provider]  Vitamin D, Ergocalciferol, (DRISDOL) 1.25 MG (50000 UNIT) CAPS capsule Take 1 capsule (50,000 Units total) by mouth every 7 (seven) days. 09/20/21   Sallyanne Kuster, NP      Allergies    Patient has no known allergies.    Review of Systems   Review of Systems  Constitutional:  Negative for fever.  Gastrointestinal:  Negative for nausea and vomiting.  Musculoskeletal:  Positive for arthralgias and joint swelling. Negative for back pain and neck pain.  Skin:  Negative for wound.  Neurological:  Negative for weakness, numbness and headaches.    Physical Exam Updated Vital Signs BP (!) 144/106 (BP Location: Right Arm)   Pulse 82   Temp 98.7 F (37.1 C)   Resp 16   SpO2 99%  Physical  Exam Constitutional:      Appearance: He is well-developed.  HENT:     Head: Normocephalic and atraumatic.  Cardiovascular:     Rate and Rhythm: Normal rate.  Pulmonary:     Effort: Pulmonary effort is normal.  Musculoskeletal:        General: Tenderness present.     Cervical back: Normal range of motion and neck supple.     Comments: Positive tenderness to the medial aspect of the left knee.  There is mild swelling and erythema to the area but no open wounds.  No gross ligament instability although I cannot do adequate testing of the medial collateral ligament.  Pedal pulses are intact.  He has normal sensation and motor function distally.  There is no pain to the ankle or hip.  Skin:    General: Skin is warm and dry.  Neurological:     Mental Status: He is alert and oriented to person, place, and time.     ED Results / Procedures / Treatments   Labs (all labs ordered are listed, but only abnormal results are displayed) Labs Reviewed - No data to display  EKG None  Radiology DG Knee Complete 4 Views Left  Result Date: 11/08/2022 CLINICAL DATA:  Pain EXAM: LEFT KNEE - COMPLETE 4 VIEW COMPARISON:  None Available. FINDINGS: No  evidence of fracture, dislocation, or joint effusion. No evidence of arthropathy or other focal bone abnormality. Soft tissues are unremarkable. IMPRESSION: No acute osseous abnormality Electronically Signed   By: Karen Kays M.D.   On: 11/08/2022 16:43    Procedures Procedures    Medications Ordered in ED Medications - No data to display  ED Course/ Medical Decision Making/ A&P                             Medical Decision Making Amount and/or Complexity of Data Reviewed Radiology: ordered.   Patient is a 37 year old who presents after a left knee injury.  X-rays were obtained which were interpreted by me and confirmed by the radiologist to show no evidence of fracture.  No subluxation/dislocation.  He is neurovascular intact distally.  No gross  ligament instability.  No other injuries are noted.  He was placed in a knee immobilizer.  He was given symptomatic care instructions.  He was given a referral to follow-up with an orthopedist if his symptoms have not improved in the next 1 to 2 days.  Return precautions were given.  Final Clinical Impression(s) / ED Diagnoses Final diagnoses:  Knee strain, left, initial encounter    Rx / DC Orders ED Discharge Orders     None         Rolan Bucco, MD 11/08/22 1709

## 2022-11-08 NOTE — ED Notes (Signed)
Discharge paperwork given and verbally understood. 

## 2023-04-21 IMAGING — DX DG CHEST 2V
2 series · 2 of 2 positions shown · non-contrast
Comparison: None.

CLINICAL DATA: Chest pain.

EXAM:
CHEST - 2 VIEW

[chest pa]
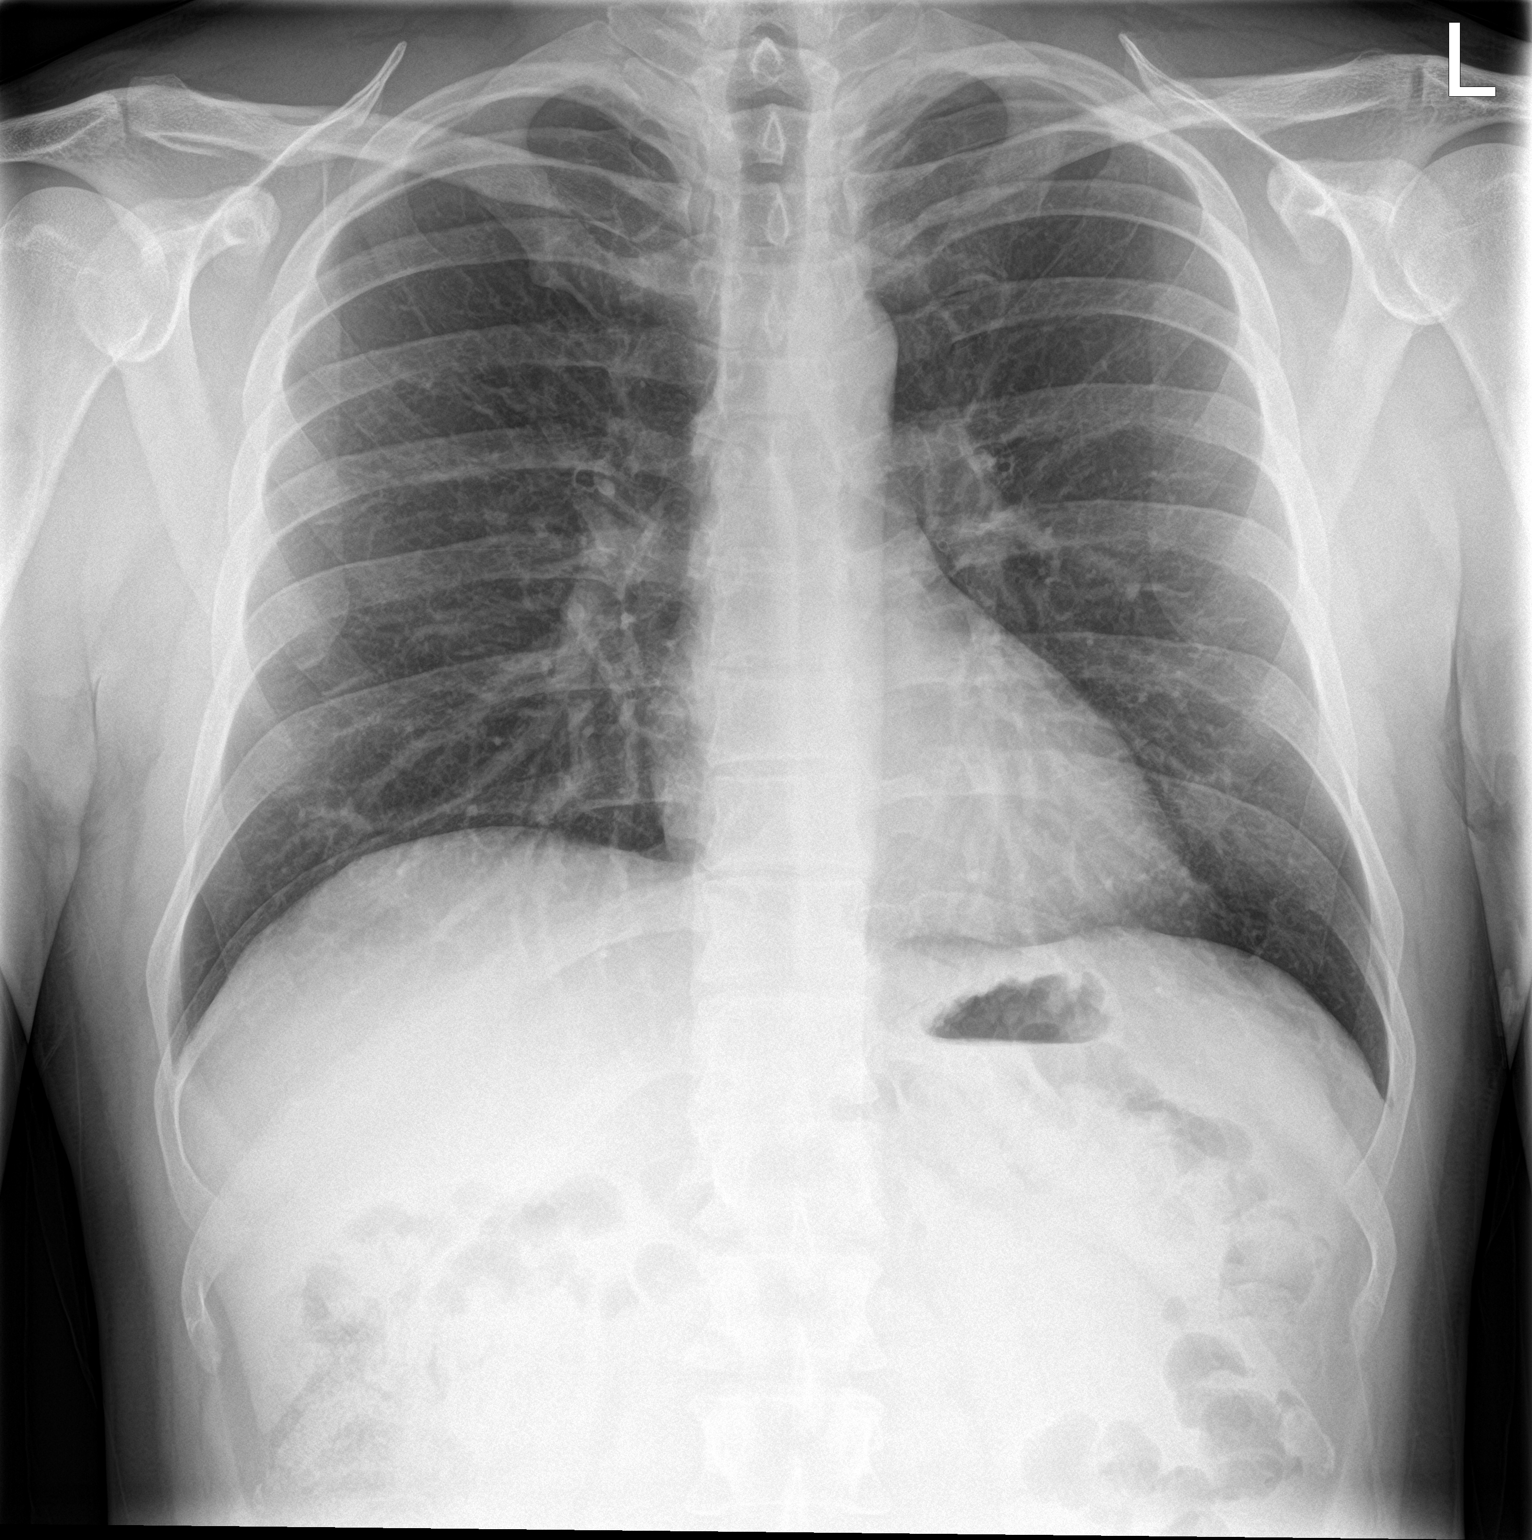

[chest lat]
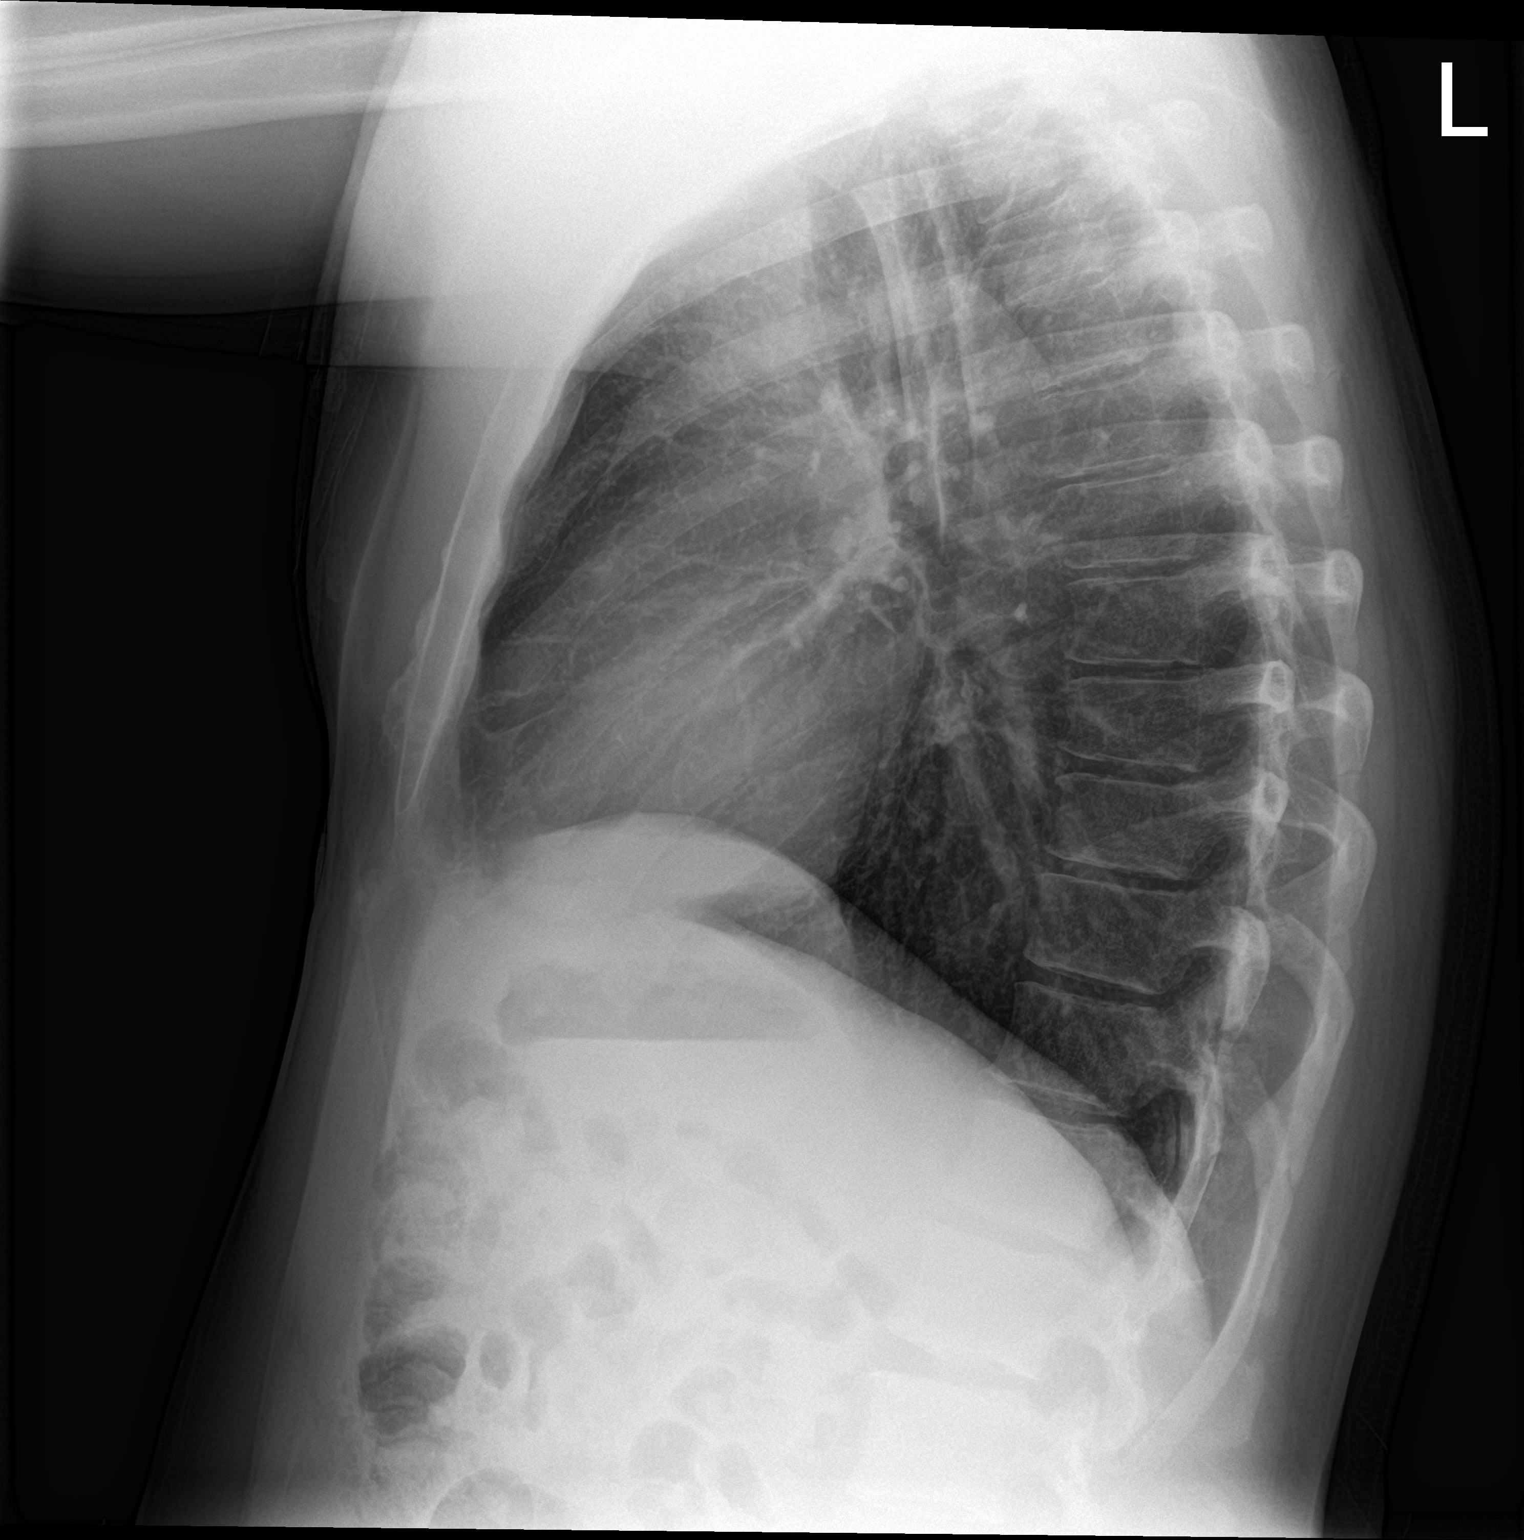

[2 of 2 positions shown; findings below may reference images not displayed]

FINDINGS: The heart size and mediastinal contours are within normal limits.
Both lungs are clear. The visualized skeletal structures are
unremarkable.
IMPRESSION: No active cardiopulmonary disease.

## 2023-05-15 ENCOUNTER — Other Ambulatory Visit: Payer: Self-pay | Admitting: Cardiology

## 2023-05-15 MED ORDER — LOSARTAN POTASSIUM 25 MG PO TABS: 25.0000 mg | ORAL_TABLET | Freq: Every day | ORAL | 1 refills | Status: DC

## 2023-05-15 MED ORDER — ATORVASTATIN CALCIUM 40 MG PO TABS: 40.0000 mg | ORAL_TABLET | Freq: Every day | ORAL | 1 refills | Status: DC

## 2023-05-26 IMAGING — CT CT HEART MORP W/ CTA COR W/ SCORE W/ CA W/CM &/OR W/O CM
1 of 14 series · 3 of 20 positions shown, 4 images · non-contrast
Comparison: None.

Addendum:
CLINICAL DATA: Chest pain

EXAM:
Cardiac/Coronary  CTA
TECHNIQUE: The patient was scanned on a Siemens Somatom go.Top scanner.

[Series 19: multiphase % cta coronary 0.60 · axial · 0.35mm/px · z∈[-1102,-1044]mm · 3 of 3179 slices shown, 4 images]
[im 795/3179  vessel]
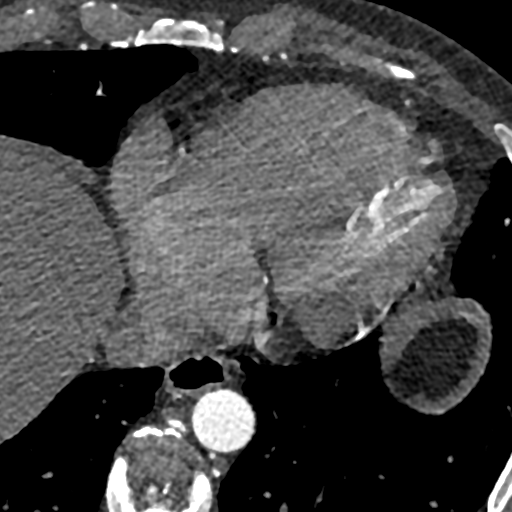
[im 795/3179  lung]
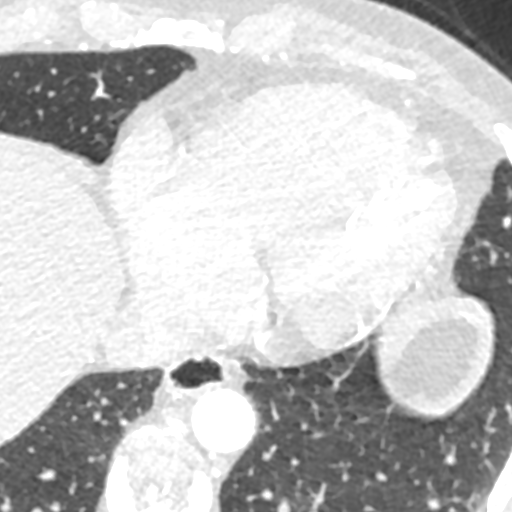
[im 1590/3179  vessel]
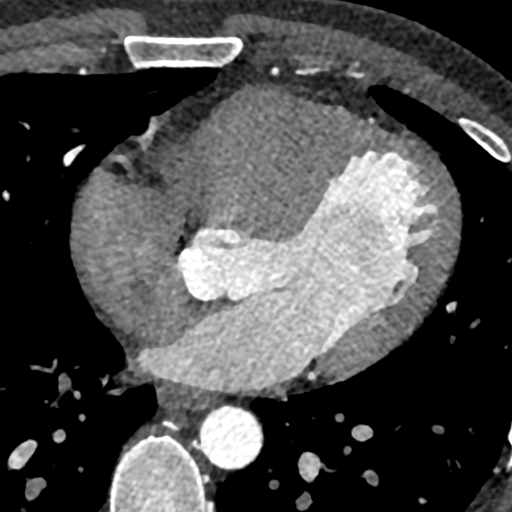
[im 2384/3179  vessel]
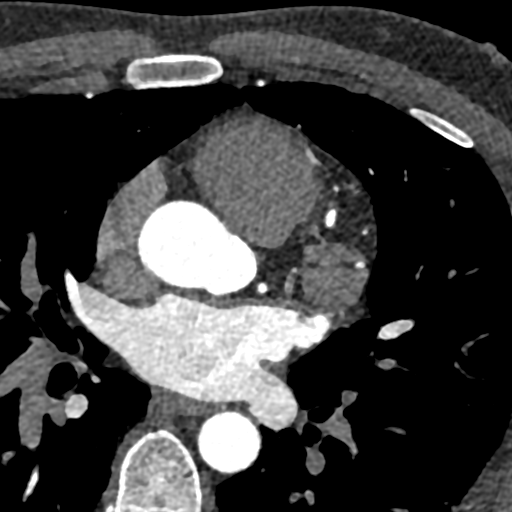

[3 of 20 positions shown; findings below may reference images not displayed]



Aortic Valve:  Trileaflet.  No calcifications.

Coronary Arteries:  Normal coronary origin.  Right dominance.

RCA is a dominant artery that gives rise to PDA and PLA. There is no
plaque.

Left main is a large artery that gives rise to LAD and LCX arteries.
There is no LM disease.

LAD has no plaque.

LCX is a non-dominant artery that gives rise to two obtuse marginal
branches. There is no plaque.

Other findings:

Normal pulmonary vein drainage into the left atrium.

Normal left atrial appendage without a thrombus.

Normal size of the pulmonary artery.
IMPRESSION: 1. Normal coronary calcium score of 0. Patient is low risk for
coronary events.

2. Normal coronary origin with right dominance.

3. No evidence of CAD.

4. CAD-RADS 0. Consider non-atherosclerotic causes of chest pain.

EXAM:
OVER-READ INTERPRETATION  CT CHEST

The following report is a limited chest CT over-read performed by
11/13/2021. This over-read does not include interpretation of cardiac
or coronary anatomy or pathology. The coronary artery calcium score
and CTA interpretation by the cardiologist is attached.
FINDINGS: Cardiovascular: There are no significant extracardiac vascular
findings.

Mediastinum/Nodes: There are no enlarged lymph nodes within the
visualized mediastinum.

Lungs/Pleura: There is no pleural effusion. The visualized lungs
appear clear.

Upper abdomen: No significant findings in the visualized upper
abdomen.

Musculoskeletal/Chest wall: No chest wall mass or suspicious osseous
findings within the visualized chest.
IMPRESSION: No significant extracardiac findings within the visualized chest.



Aortic Valve:  Trileaflet.  No calcifications.

Coronary Arteries:  Normal coronary origin.  Right dominance.

RCA is a dominant artery that gives rise to PDA and PLA. There is no
plaque.

Left main is a large artery that gives rise to LAD and LCX arteries.
There is no LM disease.

LAD has no plaque.

LCX is a non-dominant artery that gives rise to two obtuse marginal
branches. There is no plaque.

Other findings:

Normal pulmonary vein drainage into the left atrium.

Normal left atrial appendage without a thrombus.

Normal size of the pulmonary artery.
IMPRESSION: 1. Normal coronary calcium score of 0. Patient is low risk for
coronary events.

2. Normal coronary origin with right dominance.

3. No evidence of CAD.

4. CAD-RADS 0. Consider non-atherosclerotic causes of chest pain.

## 2023-06-12 ENCOUNTER — Encounter: Payer: Self-pay | Admitting: Nurse Practitioner

## 2023-06-12 ENCOUNTER — Ambulatory Visit: Payer: BC Managed Care – PPO | Admitting: Nurse Practitioner

## 2023-06-12 VITALS — BP 132/80 | HR 82 | Temp 98.4°F | Ht 68.0 in | Wt 177.4 lb

## 2023-06-12 DIAGNOSIS — Z23 Encounter for immunization: Secondary | ICD-10-CM

## 2023-06-12 DIAGNOSIS — E663 Overweight: Secondary | ICD-10-CM

## 2023-06-12 DIAGNOSIS — E7800 Pure hypercholesterolemia, unspecified: Secondary | ICD-10-CM

## 2023-06-12 DIAGNOSIS — I10 Essential (primary) hypertension: Secondary | ICD-10-CM | POA: Diagnosis not present

## 2023-06-12 DIAGNOSIS — Z0000 Encounter for general adult medical examination without abnormal findings: Secondary | ICD-10-CM

## 2023-06-12 LAB — LIPID PANEL
Cholesterol: 128 mg/dL (ref 0–200)
HDL: 35.5 mg/dL — ABNORMAL LOW (ref >39.00)
LDL Cholesterol: 66 mg/dL (ref 0–99)
NonHDL: 92.13
Total CHOL/HDL Ratio: 4
Triglycerides: 131 mg/dL (ref 0.0–149.0)
VLDL: 26.2 mg/dL (ref 0.0–40.0)

## 2023-06-12 LAB — CBC
HCT: 47.3 % (ref 39.0–52.0)
Hemoglobin: 15.6 g/dL (ref 13.0–17.0)
MCHC: 33.1 g/dL (ref 30.0–36.0)
MCV: 91.2 fL (ref 78.0–100.0)
Platelets: 269 10*3/uL (ref 150.0–400.0)
RBC: 5.19 Mil/uL (ref 4.22–5.81)
RDW: 13.7 % (ref 11.5–15.5)
WBC: 5 10*3/uL (ref 4.0–10.5)

## 2023-06-12 LAB — COMPREHENSIVE METABOLIC PANEL
ALT: 13 U/L (ref 0–53)
AST: 22 U/L (ref 0–37)
Albumin: 4.7 g/dL (ref 3.5–5.2)
Alkaline Phosphatase: 56 U/L (ref 39–117)
BUN: 15 mg/dL (ref 6–23)
CO2: 30 meq/L (ref 19–32)
Calcium: 9.5 mg/dL (ref 8.4–10.5)
Chloride: 102 meq/L (ref 96–112)
Creatinine, Ser: 0.92 mg/dL (ref 0.40–1.50)
GFR: 108.03 mL/min (ref >60.00)
Glucose, Bld: 100 mg/dL — ABNORMAL HIGH (ref 70–99)
Potassium: 4.3 meq/L (ref 3.5–5.1)
Sodium: 139 meq/L (ref 135–145)
Total Bilirubin: 1.1 mg/dL (ref 0.2–1.2)
Total Protein: 6.9 g/dL (ref 6.0–8.3)

## 2023-06-12 LAB — TSH: TSH: 0.76 u[IU]/mL (ref 0.35–5.50)

## 2023-06-12 LAB — HEMOGLOBIN A1C: Hgb A1c MFr Bld: 5.8 % (ref 4.6–6.5)

## 2023-06-12 NOTE — Assessment & Plan Note (Signed)
Discussed age-appropriate immunizations and screening exams.  Did review patient's personal, surgical, social, family histories.  Patient is up-to-date on all age-appropriate vaccinations he would like.  Update tetanus vaccine today.  Patient declined flu vaccine today.  Patient is too young for CRC screening or prostate cancer screening.  Patient was given information at discharge about preventative healthcare maintenance with anticipatory guidance.

## 2023-06-12 NOTE — Assessment & Plan Note (Signed)
History of same patient currently maintained on atorvastatin 40 mg daily.  Pending lipid panel today

## 2023-06-12 NOTE — Assessment & Plan Note (Signed)
Patient currently maintained on losartan 25 mg daily.  Blood pressure elevated upon initial check but within normal limits upon recheck.  Continue checking blood pressure intermittently at home continue losartan 25 mg daily.

## 2023-06-12 NOTE — Assessment & Plan Note (Signed)
In setting of hypertension and hyperlipidemia will check A1c today patient does live healthy lifestyle modification with plenty of exercise.  Continue

## 2023-06-12 NOTE — Progress Notes (Signed)
Established Patient Office Visit  Subjective   Patient ID: Daniel Konigsberg., male    DOB: 1986/03/22  Age: 37 y.o. MRN: [id number]  Chief Complaint  Patient presents with   Annual Exam    HPI   for complete physical and follow up of chronic conditions.   HLD: patietn is on and atorvastatin.  He was followed by cardiology for risk stratification.  No follow-up indicated at this juncture  HTN: currently on losartan. States that he does check blood pressure at home 120s/70s.  He will check his blood pressure once a month at home.  Tolerates medication well  GERD: omperazole 40 from [facility] and does well   Immunizations: -Tetanus: Completed in updat today  -Influenza: refused -Shingles: Too young -Pneumonia: Too young  Diet: Fair diet. He will eat 2 melas a day and a snack or two.  Coffee and water. [place] soda  Exercise:  States that he runs 2-4 miles everyday.   Eye exam: Completes annually. Glasses and contacts Dental exam: Needs updating     Colonoscopy: Too young, currently average risk Lung Cancer Screening: N/A  PSA: Too young, currently average risk  Sleep: 7pm-2 a and work starts at [employer]. Can function but not feeling rested. Does snore intermittently      Review of Systems  Constitutional:  Negative for chills and fever.  Respiratory:  Negative for shortness of breath.   Cardiovascular:  Negative for chest pain and leg swelling.  Gastrointestinal:  Negative for abdominal pain, blood in stool, constipation, diarrhea, nausea and vomiting.       BM every other day   Genitourinary:  Negative for dysuria and hematuria.  Neurological:  Negative for tingling and headaches.  Psychiatric/Behavioral:  Negative for hallucinations and suicidal ideas.       Objective:     BP 132/80   Pulse 82   Temp 98.4 F (36.9 C) (Oral)   Ht 5\' 8"  (1.727 m)   Wt 177 lb 6.4 oz (80.5 kg)   SpO2 92%   BMI 26.97 kg/m  BP Readings from Last 3 Encounters:  06/12/23 132/80   11/08/22 (!) 144/106  01/22/22 124/80   Wt Readings from Last 3 Encounters:  06/12/23 177 lb 6.4 oz (80.5 kg)  01/22/22 182 lb 8 oz (82.8 kg)  12/01/21 181 lb 6 oz (82.3 kg)   SpO2 Readings from Last 3 Encounters:  06/12/23 92%  11/08/22 99%  01/22/22 99%      Physical Exam Vitals and nursing note reviewed.  Constitutional:      Appearance: Normal appearance.  HENT:     Right Ear: Tympanic membrane, ear canal and external ear normal.     Left Ear: Tympanic membrane, ear canal and external ear normal.     Mouth/Throat:     Mouth: Mucous membranes are moist.     Pharynx: Oropharynx is clear.  Eyes:     Extraocular Movements: Extraocular movements intact.     Pupils: Pupils are equal, round, and reactive to light.  Cardiovascular:     Rate and Rhythm: Normal rate and regular rhythm.     Pulses: Normal pulses.     Heart sounds: Normal heart sounds.  Pulmonary:     Effort: Pulmonary effort is normal.     Breath sounds: Normal breath sounds.  Abdominal:     General: Bowel sounds are normal. There is no distension.     Palpations: There is no mass.     Tenderness: There is no  abdominal tenderness.     Hernia: No hernia is present.  Musculoskeletal:     Right lower leg: No edema.     Left lower leg: No edema.  Lymphadenopathy:     Cervical: No cervical adenopathy.  Skin:    General: Skin is warm.  Neurological:     General: No focal deficit present.     Mental Status: He is alert.     Deep Tendon Reflexes:     Reflex Scores:      Bicep reflexes are 2+ on the right side and 2+ on the left side.      Patellar reflexes are 2+ on the right side and 2+ on the left side.    Comments: Bilateral upper and lower extremity strength 5/5  Psychiatric:        Mood and Affect: Mood normal.        Behavior: Behavior normal.        Thought Content: Thought content normal.        Judgment: Judgment normal.      No results found for any visits on 06/12/23.    The ASCVD  Risk score (Arnett DK, et al., 2019) failed to calculate for the following reasons:   The 2019 ASCVD risk score is only valid for ages 5 to 47    Assessment & Plan:   Problem List Items Addressed This Visit       Cardiovascular and Mediastinum   Primary hypertension    Patient currently maintained on losartan 25 mg daily.  Blood pressure elevated upon initial check but within normal limits upon recheck.  Continue checking blood pressure intermittently at home continue losartan 25 mg daily.      Relevant Orders   CBC   Comprehensive metabolic panel   Hemoglobin A1c     Other   Preventative health care - Primary    Discussed age-appropriate immunizations and screening exams.  Did review patient's personal, surgical, social, family histories.  Patient is up-to-date on all age-appropriate vaccinations he would like.  Update tetanus vaccine today.  Patient declined flu vaccine today.  Patient is too young for CRC screening or prostate cancer screening.  Patient was given information at discharge about preventative healthcare maintenance with anticipatory guidance.      Relevant Orders   CBC   Comprehensive metabolic panel   TSH   Hypercholesteremia    History of same patient currently maintained on atorvastatin 40 mg daily.  Pending lipid panel today      Relevant Orders   Lipid panel   Hemoglobin A1c   Overweight    In setting of hypertension and hyperlipidemia will check A1c today patient does live healthy lifestyle modification with plenty of exercise.  Continue      Relevant Orders   Hemoglobin A1c   Other Visit Diagnoses     Need for tetanus booster       Relevant Orders   Tdap vaccine greater than or equal to 7yo IM (Completed)       Return in about 1 year (around 06/11/2024) for CPE and Labs.    Audria Nine, NP

## 2023-06-12 NOTE — Patient Instructions (Signed)
Nice to see you today I will be in touch with the labs once I have them Follow up with me in 1 year, sooner if you need me Message me on mychart when you need refills

## 2023-06-16 ENCOUNTER — Other Ambulatory Visit: Payer: Self-pay | Admitting: Nurse Practitioner

## 2023-06-16 DIAGNOSIS — R7303 Prediabetes: Secondary | ICD-10-CM

## 2023-11-11 ENCOUNTER — Encounter: Payer: Self-pay | Admitting: Nurse Practitioner

## 2023-11-11 MED ORDER — LOSARTAN POTASSIUM 25 MG PO TABS: 25.0000 mg | ORAL_TABLET | Freq: Every day | ORAL | 1 refills | Status: DC

## 2024-03-23 ENCOUNTER — Emergency Department (HOSPITAL_BASED_OUTPATIENT_CLINIC_OR_DEPARTMENT_OTHER)
Admission: EM | Admit: 2024-03-23 | Discharge: 2024-03-23 | Disposition: A | Discharge status: Home / Self Care | Attending: Emergency Medicine | Admitting: Emergency Medicine

## 2024-03-23 ENCOUNTER — Other Ambulatory Visit: Payer: Self-pay

## 2024-03-23 ENCOUNTER — Other Ambulatory Visit (HOSPITAL_BASED_OUTPATIENT_CLINIC_OR_DEPARTMENT_OTHER): Payer: Self-pay

## 2024-03-23 ENCOUNTER — Encounter (HOSPITAL_BASED_OUTPATIENT_CLINIC_OR_DEPARTMENT_OTHER): Payer: Self-pay | Admitting: Emergency Medicine

## 2024-03-23 DIAGNOSIS — R197 Diarrhea, unspecified: Secondary | ICD-10-CM | POA: Diagnosis present

## 2024-03-23 LAB — COMPREHENSIVE METABOLIC PANEL WITH GFR
ALT: 27 U/L (ref 0–44)
AST: 34 U/L (ref 15–41)
Albumin: 4 g/dL (ref 3.5–5.0)
Alkaline Phosphatase: 70 U/L (ref 38–126)
Anion gap: 12 (ref 5–15)
BUN: 8 mg/dL (ref 6–20)
CO2: 25 mmol/L (ref 22–32)
Calcium: 9.1 mg/dL (ref 8.9–10.3)
Chloride: 100 mmol/L (ref 98–111)
Creatinine, Ser: 0.98 mg/dL (ref 0.61–1.24)
GFR, Estimated: >60 mL/min (ref >60)
Glucose, Bld: 106 mg/dL — ABNORMAL HIGH (ref 70–99)
Potassium: 4.2 mmol/L (ref 3.5–5.1)
Sodium: 137 mmol/L (ref 135–145)
Total Bilirubin: 0.5 mg/dL (ref 0.0–1.2)
Total Protein: 6.7 g/dL (ref 6.5–8.1)

## 2024-03-23 LAB — CBC WITH DIFFERENTIAL/PLATELET
Abs Immature Granulocytes: 0.01 K/uL (ref 0.00–0.07)
Basophils Absolute: 0 K/uL (ref 0.0–0.1)
Basophils Relative: 1 %
Eosinophils Absolute: 0 K/uL (ref 0.0–0.5)
Eosinophils Relative: 0 %
HCT: 43.4 % (ref 39.0–52.0)
Hemoglobin: 14.7 g/dL (ref 13.0–17.0)
Immature Granulocytes: 0 %
Lymphocytes Relative: 28 %
Lymphs Abs: 1.5 K/uL (ref 0.7–4.0)
MCH: 30 pg (ref 26.0–34.0)
MCHC: 33.9 g/dL (ref 30.0–36.0)
MCV: 88.6 fL (ref 80.0–100.0)
Monocytes Absolute: 1.2 K/uL — ABNORMAL HIGH (ref 0.1–1.0)
Monocytes Relative: 21 %
Neutro Abs: 2.7 K/uL (ref 1.7–7.7)
Neutrophils Relative %: 50 %
Platelets: 215 K/uL (ref 150–400)
RBC: 4.9 MIL/uL (ref 4.22–5.81)
RDW: 13 % (ref 11.5–15.5)
WBC: 5.4 K/uL (ref 4.0–10.5)
nRBC: 0 % (ref 0.0–0.2)

## 2024-03-23 LAB — LIPASE, BLOOD: Lipase: 25 U/L (ref 11–51)

## 2024-03-23 LAB — URINALYSIS, ROUTINE W REFLEX MICROSCOPIC
Bilirubin Urine: NEGATIVE
Glucose, UA: NEGATIVE mg/dL
Hgb urine dipstick: NEGATIVE
Ketones, ur: NEGATIVE mg/dL
Leukocytes,Ua: NEGATIVE
Nitrite: NEGATIVE
Protein, ur: NEGATIVE mg/dL
Specific Gravity, Urine: 1.009 (ref 1.005–1.030)
pH: 5.5 (ref 5.0–8.0)

## 2024-03-23 LAB — MAGNESIUM: Magnesium: 2.1 mg/dL (ref 1.7–2.4)

## 2024-03-23 MED ORDER — AZITHROMYCIN 500 MG PO TABS
500.0000 mg | ORAL_TABLET | Freq: Every day | ORAL | 0 refills | Status: AC
  Filled 2024-03-23: qty 2, 2d supply, fill #0

## 2024-03-23 MED ORDER — AZITHROMYCIN 250 MG PO TABS
500.0000 mg | ORAL_TABLET | Freq: Once | ORAL | Status: AC
  Administered 2024-03-23: 500 mg via ORAL
  Filled 2024-03-23: qty 2

## 2024-03-23 NOTE — ED Notes (Signed)
 DC paperwork given and verbally understood.

## 2024-03-23 NOTE — ED Notes (Signed)
 Pt aware of the need for a urine and stool sample... Pt currently unable to provide either sample.

## 2024-03-23 NOTE — ED Provider Notes (Signed)
 [facility] Lowery Provider Note   CSN: [id number] Arrival date & time: 03/23/24  9094     Patient presents with: Abdominal Pain   Daniel Lowery. is a 38 y.o. male.   Patient with history of hypertension, high cholesterol presents to the emergency department today for evaluation of diarrhea and abdominal pain.  Symptoms started about a week ago.  He denies any preceding antibiotic use or suspicious food or water exposure.  Patient does play disc golf frequently in the woods.  Denies drinking any stream water.  Patient states that he had a fever of about 102 when symptoms started a week ago.  Since that time he has had temperatures in the low 100s.  He reports upper abdominal pain.  When he eats or drinks anything it causes him to have diarrhea.  He has noted a small amount of medium red blood in the stool.  He seems to have more generalized abdominal pain with bowel movements.  These have been occurring every 1-2 hours.  He states that they are mostly water.  He did have some [place] food prior to onset, however people that he was with did not have symptoms.  He has had some headaches.       Prior to Admission medications   Medication Sig Start Date End Date Taking? Authorizing Provider  atorvastatin  (LIPITOR) 40 MG tablet Take 1 tablet (40 mg total) by mouth daily. 05/15/23 05/09/24  Darliss Rogue, MD  famotidine (PEPCID) 10 MG tablet Take 10 mg by mouth 2 (two) times daily. Patient not taking: Reported on 06/12/2023    [provider]  losartan  (COZAAR ) 25 MG tablet Take 1 tablet (25 mg total) by mouth daily. 11/11/23 11/05/24  Wendee Lynwood HERO, NP  Omega-3 Fatty Acids (OMEGA-3 CF PO) Take by mouth. Not sure of the dosage Patient not taking: Reported on 06/12/2023    [provider]  omeprazole (PRILOSEC) 40 MG capsule Take 40 mg by mouth daily. 10/22/22   [provider]    Allergies: Patient has no known allergies.     Review of Systems  Updated Vital Signs BP 133/89 (BP Location: Right Arm)   Pulse 81   Temp 100.1 F (37.8 C) (Oral)   Resp 16   SpO2 100%   Physical Exam Vitals and nursing note reviewed.  Constitutional:      General: He is not in acute distress.    Appearance: He is well-developed.  HENT:     Head: Normocephalic and atraumatic.  Eyes:     General:        Right eye: No discharge.        Left eye: No discharge.     Conjunctiva/sclera: Conjunctivae normal.  Cardiovascular:     Rate and Rhythm: Normal rate and regular rhythm.     Heart sounds: Normal heart sounds.  Pulmonary:     Effort: Pulmonary effort is normal.     Breath sounds: Normal breath sounds.  Abdominal:     Palpations: Abdomen is soft.     Tenderness: There is no abdominal tenderness.     Comments: No significant abdominal tenderness to palpation at time of exam  Musculoskeletal:     Cervical back: Normal range of motion and neck supple.  Skin:    General: Skin is warm and dry.  Neurological:     Mental Status: He is alert.     (all labs ordered are listed, but only abnormal results are  displayed) Labs Reviewed  CBC WITH DIFFERENTIAL/PLATELET - Abnormal; Notable for the following components:      Result Value   Monocytes Absolute 1.2 (*)    All other components within normal limits  COMPREHENSIVE METABOLIC PANEL WITH GFR - Abnormal; Notable for the following components:   Glucose, Bld 106 (*)    All other components within normal limits  GASTROINTESTINAL PANEL BY PCR, STOOL (REPLACES STOOL CULTURE)  C DIFFICILE QUICK SCREEN W PCR REFLEX    LIPASE, BLOOD  URINALYSIS, ROUTINE W REFLEX MICROSCOPIC  MAGNESIUM   ED Course  Patient seen and examined. History obtained directly from patient.   Labs/EKG: Ordered CBC, CMP, lipase, UA.  Magnesium added.  Will obtain stool studies if possible.  Imaging: None ordered  Medications/Fluids: None ordered  Initial impression: Abdominal pain and  diarrhea with low-grade fever x 1 week.  Overall patient is well-appearing.  Does not appear to be septic.  Abdominal exam is actually fairly benign.  1:13 PM Reassessment performed. Patient appears stable, comfortable.  Labs personally reviewed and interpreted including: CBC unremarkable with normal white blood cell count and hemoglobin, reactive lymphocytes noted; CMP normal transaminases and renal function; lipase was normal; magnesium was normal; UA without signs of infection or dehydration.  Unable to obtain stool studies while here in emergency department after 4 hours.  Imaging personally visualized and interpreted including: None ordered  Reviewed pertinent lab work and imaging with patient at bedside. Questions answered.   Most current vital signs reviewed and are as follows: BP 133/89 (BP Location: Right Arm)   Pulse 81   Temp 100.1 F (37.8 C) (Oral)   Resp 16   SpO2 100%   Plan: Discharge to home.  Will go ahead and start azithromycin  500 mg for 3 days.  First dose given in the emergency department.  Encouraged use of Pepto-Bismol if medication for diarrhea is desired.  Prescriptions written for: Azithromycin  500 mg daily x 2 days  Other home care instructions discussed: Maintain good hydration  ED return instructions discussed: The patient was urged to return to the Emergency Department immediately with worsening of current symptoms, worsening abdominal pain, persistent vomiting, blood noted in stools, persist high fever, or any other concerns. The patient verbalized understanding.   Follow-up instructions discussed: Patient encouraged to follow-up with their PCP in 5 days.   EKG: None  Radiology: No results found.   Procedures   Medications Ordered in the ED - No data to display                                  Medical Decision Making Amount and/or Complexity of Data Reviewed Labs: ordered.  Risk Prescription drug management.   For this patient's  complaint of abdominal pain, the following conditions were considered on the differential diagnosis: gastritis/PUD, enteritis/duodenitis, appendicitis, cholelithiasis/cholecystitis, cholangitis, pancreatitis, ruptured viscus, colitis, diverticulitis, small/large bowel obstruction, proctitis, cystitis, pyelonephritis, ureteral colic, aortic dissection, aortic aneurysm. Atypical chest etiologies were also considered including ACS, PE, and pneumonia.  1 week of diarrhea, occasionally bloody, low-grade fever raises concern for infectious diarrhea.  Patient's lab work is normal and reassuring.  No concerning exposures.  Abdominal exam is soft and nontender.  Given time course, low-grade temperature --Will treat empirically with azithromycin .  Patient did note some blood in the stool at times, no concerning for large amount of blood loss.      Final diagnoses:  Diarrhea of presumed  infectious origin    ED Discharge Orders          Ordered    azithromycin  (ZITHROMAX ) 500 MG tablet  Daily        03/23/24 1302               Desiderio Chew, PA-C 03/23/24 1317    Emil Share, DO 03/23/24 1406

## 2024-03-23 NOTE — ED Notes (Signed)
 Per EDP order, pt given graham crackers and ginger ale Pt verbalized understanding to utilize call bell if nausea or emesis occur.

## 2024-03-23 NOTE — Discharge Instructions (Addendum)
 Please read and follow all provided instructions.  Your diagnoses today include:  1. Diarrhea of presumed infectious origin     Tests performed today include: Complete blood cell count: Normal infection fighting cells and blood cell counts Complete metabolic panel: Normal liver and kidney function Magnesium: Ordered Lipase (pancreas function test): Was normal Urinalysis (urine test): No signs of infection in the urine Vital signs. See below for your results today.   Medications prescribed:  Azithromycin  - antibiotic for respiratory infection  You have been prescribed an antibiotic medicine: take the entire course of medicine even if you are feeling better. Stopping early can cause the antibiotic not to work.  Take any prescribed medications only as directed.  Home care instructions:  Follow any educational materials contained in this packet.  Follow-up instructions: Please follow-up with your primary care provider in the next 5 days for further evaluation of your symptoms.    Return instructions:  SEEK IMMEDIATE MEDICAL ATTENTION IF: The pain does not go away or becomes severe  A temperature above 101F develops  Repeated vomiting occurs (multiple episodes)  The pain becomes localized to portions of the abdomen. The right side could possibly be appendicitis. In an adult, the left lower portion of the abdomen could be colitis or diverticulitis.  Blood is being passed in stools or vomit (bright red or black tarry stools)  You develop chest pain, difficulty breathing, dizziness or fainting, or become confused, poorly responsive, or inconsolable (young children) If you have any other emergent concerns regarding your health  Additional Information: Abdominal (belly) pain can be caused by many things. Your caregiver performed an examination and possibly ordered blood/urine tests and imaging (CT scan, x-rays, ultrasound). Many cases can be observed and treated at home after initial  evaluation in the emergency department. Even though you are being discharged home, abdominal pain can be unpredictable. Therefore, you need a repeated exam if your pain does not resolve, returns, or worsens. Most patients with abdominal pain don't have to be admitted to the hospital or have surgery, but serious problems like appendicitis and gallbladder attacks can start out as nonspecific pain. Many abdominal conditions cannot be diagnosed in one visit, so follow-up evaluations are very important.  Your vital signs today were: BP 133/89 (BP Location: Right Arm)   Pulse 81   Temp 100.1 F (37.8 C) (Oral)   Resp 16   SpO2 100%  If your blood pressure (bp) was elevated above 135/85 this visit, please have this repeated by your doctor within one month. --------------

## 2024-03-23 NOTE — ED Triage Notes (Signed)
 Pt c/o ABD pain x 1 week with diarrhea that worsens after eating. Endorses concern for blood in stool

## 2024-05-09 ENCOUNTER — Other Ambulatory Visit: Payer: Self-pay | Admitting: Nurse Practitioner

## 2024-05-11 NOTE — Telephone Encounter (Signed)
 Patient needs a CPE in scheduled for 06/12/2024 or within 2 weeks after that date. Any slot is ok. This is in order to continue getting refills

## 2024-05-12 NOTE — Telephone Encounter (Signed)
Lvm and sent MyChart

## 2024-07-27 ENCOUNTER — Encounter: Payer: Self-pay | Admitting: Nurse Practitioner

## 2024-07-27 ENCOUNTER — Ambulatory Visit: Admitting: Nurse Practitioner

## 2024-07-27 VITALS — BP 128/80 | HR 66 | Temp 98.0°F | Ht 67.5 in | Wt 163.6 lb

## 2024-07-27 DIAGNOSIS — Z0000 Encounter for general adult medical examination without abnormal findings: Secondary | ICD-10-CM | POA: Diagnosis not present

## 2024-07-27 DIAGNOSIS — Z8249 Family history of ischemic heart disease and other diseases of the circulatory system: Secondary | ICD-10-CM

## 2024-07-27 DIAGNOSIS — I10 Essential (primary) hypertension: Secondary | ICD-10-CM | POA: Diagnosis not present

## 2024-07-27 DIAGNOSIS — E7800 Pure hypercholesterolemia, unspecified: Secondary | ICD-10-CM

## 2024-07-27 LAB — CBC WITH DIFFERENTIAL/PLATELET
Basophils Absolute: 0 K/uL (ref 0.0–0.1)
Basophils Relative: 0.6 % (ref 0.0–3.0)
Eosinophils Absolute: 0.2 K/uL (ref 0.0–0.7)
Eosinophils Relative: 3.5 % (ref 0.0–5.0)
HCT: 45.2 % (ref 39.0–52.0)
Hemoglobin: 15.2 g/dL (ref 13.0–17.0)
Lymphocytes Relative: 31.2 % (ref 12.0–46.0)
Lymphs Abs: 1.4 K/uL (ref 0.7–4.0)
MCHC: 33.7 g/dL (ref 30.0–36.0)
MCV: 88.4 fl (ref 78.0–100.0)
Monocytes Absolute: 0.5 K/uL (ref 0.1–1.0)
Monocytes Relative: 11.3 % (ref 3.0–12.0)
Neutro Abs: 2.4 K/uL (ref 1.4–7.7)
Neutrophils Relative %: 53.4 % (ref 43.0–77.0)
Platelets: 226 K/uL (ref 150.0–400.0)
RBC: 5.11 Mil/uL (ref 4.22–5.81)
RDW: 14.1 % (ref 11.5–15.5)
WBC: 4.5 K/uL (ref 4.0–10.5)

## 2024-07-27 LAB — COMPREHENSIVE METABOLIC PANEL WITH GFR
ALT: 16 U/L (ref 3–53)
AST: 28 U/L (ref 5–37)
Albumin: 4.6 g/dL (ref 3.5–5.2)
Alkaline Phosphatase: 51 U/L (ref 39–117)
BUN: 11 mg/dL (ref 6–23)
CO2: 30 meq/L (ref 19–32)
Calcium: 9.2 mg/dL (ref 8.4–10.5)
Chloride: 99 meq/L (ref 96–112)
Creatinine, Ser: 0.84 mg/dL (ref 0.40–1.50)
GFR: 112.36 mL/min
Glucose, Bld: 91 mg/dL (ref 70–99)
Potassium: 4.2 meq/L (ref 3.5–5.1)
Sodium: 139 meq/L (ref 135–145)
Total Bilirubin: 0.6 mg/dL (ref 0.2–1.2)
Total Protein: 7.2 g/dL (ref 6.0–8.3)

## 2024-07-27 LAB — LIPID PANEL
Cholesterol: 203 mg/dL — ABNORMAL HIGH (ref 28–200)
HDL: 50.1 mg/dL
LDL Cholesterol: 127 mg/dL — ABNORMAL HIGH (ref 10–99)
NonHDL: 152.64
Total CHOL/HDL Ratio: 4
Triglycerides: 128 mg/dL (ref 10.0–149.0)
VLDL: 25.6 mg/dL (ref 0.0–40.0)

## 2024-07-27 LAB — TSH: TSH: 1.37 u[IU]/mL (ref 0.35–5.50)

## 2024-07-27 NOTE — Assessment & Plan Note (Signed)
 History of the same.  Patient started eating better and reduce his alcohol intake.  Patient was getting lightheaded and dizzy on medication he self discontinued.  Has been off 6 months blood pressure controlled here patient is checking blood pressure at home intermittently.  We will continue with lifestyle modifications only

## 2024-07-27 NOTE — Patient Instructions (Signed)
 Nice to see you today I will be in touch with the labs once I have them Follow up with em in 1 year, sooner if you need me

## 2024-07-27 NOTE — Progress Notes (Signed)
 "  Established Patient Office Visit  Subjective   Patient ID: Daniel Rosenow., male    DOB: Nov 28, 1985  Age: 39 y.o. MRN: [id number]  Chief Complaint  Patient presents with   Annual Exam    HPI  HTN: Patient currently maintained on losartan  25 mg daily. States that he was feeling light headed and has been off for 6 months. States that he has been checking it at home and it has been good.   HLD: Patient currently maintained on atorvastatin  40 mg daily. States that he has not been taking it for approx 6 months.   GERD: Maintained on omeprazole 40 mg. States that he has cut back on the alcohol and does not have to use the medication   for complete physical and follow up of chronic conditions.  Immunizations: -Tetanus: Completed in 2024 -Influenza:refused  -Shingles: Too young -Pneumonia: Too young - HPV: Discussed in office  Diet: Fair diet. 2 meals a day. He does not snack often. Healthy if he does. Coffee and water Exercise: he runs 4 days a week 30-41mins. He will do simple resistance exercises  Eye exam: Completes annually. Galsses  and contacts  Dental exam: Completes every 3 months    Colonoscopy: Too young.  Likely higher risk due to mother's history of intestinal cancer?  Start screening patient age 20 Lung Cancer Screening: N/A  PSA: Too young, currently average risk  Sleep: going ot bed around 630-7 and will get up at 2am. Feels rested most days. Delievery driver        Review of Systems  Constitutional:  Negative for chills and fever.  Respiratory:  Negative for shortness of breath.   Cardiovascular:  Negative for chest pain and leg swelling.  Gastrointestinal:  Negative for abdominal pain, blood in stool, constipation, diarrhea, nausea and vomiting.       BM every other day   Genitourinary:  Negative for dysuria and hematuria.  Neurological:  Negative for dizziness, tingling and headaches.  Psychiatric/Behavioral:  Negative for hallucinations and  suicidal ideas.       Objective:     BP 128/80   Pulse 66   Temp 98 F (36.7 C) (Oral)   Ht 5' 7.5 (1.715 m)   Wt 163 lb 9.6 oz (74.2 kg)   SpO2 98%   BMI 25.25 kg/m  BP Readings from Last 3 Encounters:  07/27/24 128/80  03/23/24 122/85  06/12/23 132/80   Wt Readings from Last 3 Encounters:  07/27/24 163 lb 9.6 oz (74.2 kg)  06/12/23 177 lb 6.4 oz (80.5 kg)  01/22/22 182 lb 8 oz (82.8 kg)   SpO2 Readings from Last 3 Encounters:  07/27/24 98%  03/23/24 97%  06/12/23 92%      Physical Exam Vitals and nursing note reviewed.  Constitutional:      Appearance: Normal appearance.  HENT:     Right Ear: Tympanic membrane, ear canal and external ear normal.     Left Ear: Tympanic membrane, ear canal and external ear normal.     Mouth/Throat:     Mouth: Mucous membranes are moist.     Pharynx: Oropharynx is clear.  Eyes:     Extraocular Movements: Extraocular movements intact.     Pupils: Pupils are equal, round, and reactive to light.  Cardiovascular:     Rate and Rhythm: Normal rate and regular rhythm.     Pulses: Normal pulses.     Heart sounds: Normal heart sounds.  Pulmonary:  Effort: Pulmonary effort is normal.     Breath sounds: Normal breath sounds.  Abdominal:     General: Bowel sounds are normal. There is no distension.     Palpations: There is no mass.     Tenderness: There is no abdominal tenderness.     Hernia: No hernia is present.  Genitourinary:    Comments: deferred Musculoskeletal:     Right lower leg: No edema.     Left lower leg: No edema.  Lymphadenopathy:     Cervical: No cervical adenopathy.  Skin:    General: Skin is warm.  Neurological:     General: No focal deficit present.     Mental Status: He is alert.     Deep Tendon Reflexes:     Reflex Scores:      Bicep reflexes are 2+ on the right side and 2+ on the left side.      Patellar reflexes are 2+ on the right side and 2+ on the left side.    Comments: Bilateral upper and  lower extremity strength 5/5  Psychiatric:        Mood and Affect: Mood normal.        Behavior: Behavior normal.        Thought Content: Thought content normal.        Judgment: Judgment normal.      No results found for any visits on 07/27/24.    The ASCVD Risk score (Arnett DK, et al., 2019) failed to calculate for the following reasons:   The 2019 ASCVD risk score is only valid for ages 65 to 89    Assessment & Plan:   Problem List Items Addressed This Visit       Cardiovascular and Mediastinum   Primary hypertension   History of the same.  Patient started eating better and reduce his alcohol intake.  Patient was getting lightheaded and dizzy on medication he self discontinued.  Has been off 6 months blood pressure controlled here patient is checking blood pressure at home intermittently.  We will continue with lifestyle modifications only      Relevant Orders   CBC with Differential/Platelet   Comprehensive metabolic panel with GFR   TSH     Other   Preventative health care - Primary   Discussed age-appropriate immunizations and screening exams.  Did review patient's personal, surgical, social, family histories.  Patient is up-to-date with all age-appropriate vaccinations he would like.  We did discuss HPV vaccines in office.  Patient declined flu vaccine today.  Patient is too young for CRC screening but we will start screening patient age 36 due to mother's history.  Patient is too young for prostate cancer screening.  Patient was given information at discharge about preventative healthcare maintenance with anticipatory guidance.      Relevant Orders   CBC with Differential/Platelet   Comprehensive metabolic panel with GFR   TSH   Hypercholesteremia   History of the same patient discontinued statin medication approximately 6 months ago.  Pending lipid panel today      Relevant Orders   Lipid panel   Lipoprotein A (LPA)   Family history of heart disease in male  family member before age 91   History of the same.  Patient's father had a heart attack and CABG before age 34.  Patient has had a calcium  score of 0 a couple years ago.  Pending lipid panel and LPA today      Relevant Orders  Lipid panel   Lipoprotein A (LPA)    Return in about 1 year (around 07/27/2025) for CPE and Labs.    Adina Crandall, NP  "

## 2024-07-27 NOTE — Assessment & Plan Note (Signed)
 History of the same patient discontinued statin medication approximately 6 months ago.  Pending lipid panel today

## 2024-07-27 NOTE — Assessment & Plan Note (Signed)
 Discussed age-appropriate immunizations and screening exams.  Did review patient's personal, surgical, social, family histories.  Patient is up-to-date with all age-appropriate vaccinations he would like.  We did discuss HPV vaccines in office.  Patient declined flu vaccine today.  Patient is too young for CRC screening but we will start screening patient age 39 due to mother's history.  Patient is too young for prostate cancer screening.  Patient was given information at discharge about preventative healthcare maintenance with anticipatory guidance.

## 2024-07-27 NOTE — Assessment & Plan Note (Signed)
 History of the same.  Patient's father had a heart attack and CABG before age 39.  Patient has had a calcium  score of 0 a couple years ago.  Pending lipid panel and LPA today

## 2024-08-02 ENCOUNTER — Ambulatory Visit: Payer: Self-pay | Admitting: Nurse Practitioner

## 2024-08-02 DIAGNOSIS — E7800 Pure hypercholesterolemia, unspecified: Secondary | ICD-10-CM

## 2024-08-03 MED ORDER — ATORVASTATIN CALCIUM 40 MG PO TABS: 40.0000 mg | ORAL_TABLET | Freq: Every day | ORAL | 0 refills | Status: AC

## 2024-08-03 NOTE — Telephone Encounter (Signed)
 Needs 3 month fasting lab appointment to recheck cholesterol. Lab orders have been placed

## 2024-08-04 LAB — LIPOPROTEIN A (LPA): Lipoprotein (a): <10 nmol/L

## 2024-08-14 ENCOUNTER — Other Ambulatory Visit: Payer: Self-pay | Admitting: Nurse Practitioner

## 2024-11-02 ENCOUNTER — Other Ambulatory Visit
